# Patient Record
Sex: Male | Born: 1935 | Race: White | Hispanic: No | Marital: Married | Smoking: Former smoker
Health system: Southern US, Community
[De-identification: ages and names within clinical notes are randomized; demographics above are authoritative.]

## PROBLEM LIST (undated history)

## (undated) DIAGNOSIS — M720 Palmar fascial fibromatosis [Dupuytren]: Secondary | ICD-10-CM

## (undated) DIAGNOSIS — N189 Chronic kidney disease, unspecified: Secondary | ICD-10-CM

## (undated) DIAGNOSIS — D649 Anemia, unspecified: Secondary | ICD-10-CM

## (undated) DIAGNOSIS — N529 Male erectile dysfunction, unspecified: Secondary | ICD-10-CM

## (undated) DIAGNOSIS — G4730 Sleep apnea, unspecified: Secondary | ICD-10-CM

## (undated) DIAGNOSIS — M6530 Trigger finger, unspecified finger: Secondary | ICD-10-CM

## (undated) DIAGNOSIS — J9600 Acute respiratory failure, unspecified whether with hypoxia or hypercapnia: Secondary | ICD-10-CM

## (undated) DIAGNOSIS — N400 Enlarged prostate without lower urinary tract symptoms: Secondary | ICD-10-CM

## (undated) DIAGNOSIS — I499 Cardiac arrhythmia, unspecified: Secondary | ICD-10-CM

## (undated) DIAGNOSIS — E785 Hyperlipidemia, unspecified: Secondary | ICD-10-CM

## (undated) DIAGNOSIS — M069 Rheumatoid arthritis, unspecified: Secondary | ICD-10-CM

## (undated) DIAGNOSIS — I2510 Atherosclerotic heart disease of native coronary artery without angina pectoris: Secondary | ICD-10-CM

## (undated) DIAGNOSIS — E119 Type 2 diabetes mellitus without complications: Secondary | ICD-10-CM

## (undated) DIAGNOSIS — I10 Essential (primary) hypertension: Secondary | ICD-10-CM

## (undated) HISTORY — DX: Essential (primary) hypertension: I10

## (undated) HISTORY — DX: Anemia, unspecified: D64.9

## (undated) HISTORY — DX: Trigger finger, unspecified finger: M65.30

## (undated) HISTORY — DX: Atherosclerotic heart disease of native coronary artery without angina pectoris: I25.10

## (undated) HISTORY — DX: Rheumatoid arthritis, unspecified: M06.9

## (undated) HISTORY — DX: Acute respiratory failure, unspecified whether with hypoxia or hypercapnia: J96.00

## (undated) HISTORY — DX: Type 2 diabetes mellitus without complications: E11.9

## (undated) HISTORY — DX: Hyperlipidemia, unspecified: E78.5

## (undated) HISTORY — DX: Cardiac arrhythmia, unspecified: I49.9

## (undated) HISTORY — PX: INSERT / REPLACE / REMOVE PACEMAKER: SUR710

---

## 2010-05-26 ENCOUNTER — Ambulatory Visit: Payer: Self-pay | Admitting: Internal Medicine

## 2010-05-29 ENCOUNTER — Inpatient Hospital Stay: Payer: Self-pay | Admitting: Internal Medicine

## 2011-05-08 ENCOUNTER — Ambulatory Visit: Payer: Self-pay | Admitting: Cardiology

## 2011-08-13 ENCOUNTER — Encounter: Payer: Self-pay | Admitting: Cardiovascular Disease

## 2011-08-13 ENCOUNTER — Ambulatory Visit (INDEPENDENT_AMBULATORY_CARE_PROVIDER_SITE_OTHER): Payer: Medicare Other | Admitting: Cardiovascular Disease

## 2011-08-13 DIAGNOSIS — R001 Bradycardia, unspecified: Secondary | ICD-10-CM

## 2011-08-13 DIAGNOSIS — I498 Other specified cardiac arrhythmias: Secondary | ICD-10-CM

## 2011-08-13 DIAGNOSIS — I10 Essential (primary) hypertension: Secondary | ICD-10-CM

## 2011-08-13 DIAGNOSIS — R0602 Shortness of breath: Secondary | ICD-10-CM

## 2011-08-13 DIAGNOSIS — I441 Atrioventricular block, second degree: Secondary | ICD-10-CM

## 2011-08-13 DIAGNOSIS — E119 Type 2 diabetes mellitus without complications: Secondary | ICD-10-CM | POA: Insufficient documentation

## 2011-08-13 NOTE — Assessment & Plan Note (Signed)
Underlying atrial rate in the 70s. Ventricular rate in the 30s. Relatively asymptomatic. We have suggested repeat EKG or Holter testing if he has worsening symptoms of shortness of breath or edema, lightheadedness or near-syncope. No plan for pacemaker at this time.

## 2011-08-13 NOTE — Assessment & Plan Note (Signed)
Significant bradycardia noted. He reports being asymptomatic. Holter does show improvement in his heart rate with exertion. He is able to exert himself on a treadmill without symptoms of lightheadedness or near syncope. We have suggested that we continue to monitor him closely and that if he has symptoms of lightheadedness or worsening shortness of breath or edema, that he contact Dr. Graciela Husbands, Dr. Lady Gary or our office for further evaluation.

## 2011-08-13 NOTE — Progress Notes (Signed)
Patient ID: Randy Lopez, male    DOB: 01/02/1931, 77 y.o.   MRN: [id number]  HPI Comments: Randy Lopez is a very pleasant 77 year old gentleman, patient of Dr. Daniel Nones who presents for a second opinion regarding his bradycardia.  He reports that he has had a low heart rate for quite some time. He does have a history of hypertension, diabetes, rheumatoid arthritis, hyperlipidemia. He did smoke for 30 years, heavily during that time. He does have chronic mild shortness of breath but is able to exert himself without any significant symptoms. He reports being able to treadmill for at least 15 minutes at a time. He denies any lightheadedness or dizziness, no syncope. He denies any significant orthostasis with standing.  He is not on any inhalers.  He did complete a Holter monitor that showed bradycardia with average heart rate 49 beats per minute, minimum heart rate 31 beats per minute with second degree heart block noted, longest pause of 2.8 seconds at 2:30 in the morning.  He has been diagnosed with sleep apnea though reports that he does not need CPAP.  He did have a stress test and was able to walk for 4 minutes achieving 5.8 METS. This was done June 2011. He showed no significant ischemia, normal LV function  Echocardiogram done 05/2010 showed normal LV function, mild MR, mild diastolic dysfunction.  Sleep apnea study showed snoring 8% of the time, mild to moderate OSA, CPAP was recommended  EKG shows sinus rhythm with second-degree AV block, 2-1 conduction,  Left anterior fascicular block   Outpatient Encounter Prescriptions as of 08/13/2011  Medication Sig Dispense Refill  . amLODipine (NORVASC) 5 MG tablet Take 5 mg by mouth daily.        . calcium carbonate (OS-CAL) 600 MG TABS Take 600 mg by mouth 2 (two) times daily with a meal.        . Cholecalciferol (VITAMIN D3) 1000 UNITS CAPS Take 1,000 Units by mouth 1 day or 1 dose.        . folic acid (FOLVITE) 1 MG tablet Take 1 mg by  mouth daily.        Marland Kitchen glimepiride (AMARYL) 4 MG tablet Take 4 mg by mouth daily before breakfast.        . hydrochlorothiazide (,MICROZIDE/HYDRODIURIL,) 12.5 MG capsule Take 12.5 mg by mouth daily.        Marland Kitchen inFLIXimab (REMICADE) 100 MG injection Inject 100 mg into the vein every 8 (eight) weeks.        Marland Kitchen lisinopril (PRINIVIL,ZESTRIL) 40 MG tablet Take 40 mg by mouth daily.        . methotrexate 2.5 MG tablet Take 2.5 mg by mouth. Take 6 tablets by mouth once a week.        Review of Systems  Constitutional: Negative.   HENT: Negative.   Eyes: Negative.   Respiratory: Negative.   Cardiovascular: Negative.   Gastrointestinal: Negative.   Musculoskeletal: Negative.   Skin: Negative.   Neurological: Negative.   Hematological: Negative.   Psychiatric/Behavioral: Negative.   All other systems reviewed and are negative.    BP 122/60  Pulse 32  Ht 6\' 2"  (1.88 m)  Wt 239 lb (108.41 kg)  BMI 30.69 kg/m2   Physical Exam  Nursing note and vitals reviewed. Constitutional: He is oriented to person, place, and time. He appears well-developed and well-nourished.  HENT:  Head: Normocephalic.  Nose: Nose normal.  Mouth/Throat: Oropharynx is clear and moist.  Eyes: Conjunctivae are  normal. Pupils are equal, round, and reactive to light.  Neck: Normal range of motion. Neck supple. No JVD present.  Cardiovascular: Regular rhythm, S1 normal, S2 normal, normal heart sounds and intact distal pulses.  Bradycardia present.  Exam reveals no gallop and no friction rub.   No murmur heard.      Trace edema above the sock line  Pulmonary/Chest: Effort normal and breath sounds normal. No respiratory distress. He has no wheezes. He has no rales. He exhibits no tenderness.  Abdominal: Soft. Bowel sounds are normal. He exhibits no distension. There is no tenderness.  Musculoskeletal: Normal range of motion. He exhibits no edema and no tenderness.  Lymphadenopathy:    He has no cervical adenopathy.    Neurological: He is alert and oriented to person, place, and time. Coordination normal.  Skin: Skin is warm and dry. No rash noted. No erythema.  Psychiatric: He has a normal mood and affect. His behavior is normal. Judgment and thought content normal.           Assessment and Plan

## 2011-08-13 NOTE — Assessment & Plan Note (Signed)
He does have mild shortness of breath though this is stable. He does have a 30 year smoking history, currently not on inhalers. Uncertain if his mild shortness of breath is secondary to bradycardia or mild COPD. We have suggested if his shortness of breath gets worse, that he contact any one of his physicians.

## 2011-08-13 NOTE — Assessment & Plan Note (Signed)
We have encouraged continued exercise, careful diet management in an effort to lose weight. 

## 2011-08-13 NOTE — Assessment & Plan Note (Signed)
Blood pressure is well controlled on today's visit. No changes made to the medications. 

## 2011-08-13 NOTE — Patient Instructions (Signed)
You are doing well. No medication changes were made. Please call us if you have new issues that need to be addressed before your next appt.  We will call you for a follow up Appt. In 12 months  

## 2011-08-16 ENCOUNTER — Encounter: Payer: Self-pay | Admitting: Cardiovascular Disease

## 2012-10-14 DIAGNOSIS — R972 Elevated prostate specific antigen [PSA]: Secondary | ICD-10-CM | POA: Insufficient documentation

## 2013-06-04 ENCOUNTER — Ambulatory Visit: Payer: Self-pay | Admitting: Cardiology

## 2013-06-04 LAB — BASIC METABOLIC PANEL
Anion Gap: 5 — ABNORMAL LOW (ref 7–16)
BUN: 22 mg/dL — ABNORMAL HIGH (ref 7–18)
Chloride: 104 mmol/L (ref 98–107)
Creatinine: 1.6 mg/dL — ABNORMAL HIGH (ref 0.60–1.30)
EGFR (Non-African Amer.): 40 — ABNORMAL LOW
Potassium: 4 mmol/L (ref 3.5–5.1)
Sodium: 138 mmol/L (ref 136–145)

## 2013-06-04 LAB — PROTIME-INR: Prothrombin Time: 16.4 secs — ABNORMAL HIGH (ref 11.5–14.7)

## 2013-06-17 ENCOUNTER — Ambulatory Visit: Payer: Self-pay | Admitting: Cardiology

## 2014-06-06 ENCOUNTER — Inpatient Hospital Stay: Payer: Self-pay | Admitting: Internal Medicine

## 2014-06-06 LAB — COMPREHENSIVE METABOLIC PANEL
ALBUMIN: 3.4 g/dL (ref 3.4–5.0)
ALK PHOS: 86 U/L
ANION GAP: 6 — AB (ref 7–16)
BUN: 32 mg/dL — AB (ref 7–18)
Bilirubin,Total: 0.8 mg/dL (ref 0.2–1.0)
CO2: 27 mmol/L (ref 21–32)
CREATININE: 1.71 mg/dL — AB (ref 0.60–1.30)
Calcium, Total: 9 mg/dL (ref 8.5–10.1)
Chloride: 102 mmol/L (ref 98–107)
EGFR (Non-African Amer.): 37 — ABNORMAL LOW
GFR CALC AF AMER: 43 — AB
Glucose: 183 mg/dL — ABNORMAL HIGH (ref 65–99)
OSMOLALITY: 282 (ref 275–301)
POTASSIUM: 4.5 mmol/L (ref 3.5–5.1)
SGOT(AST): 158 U/L — ABNORMAL HIGH (ref 15–37)
SGPT (ALT): 207 U/L — ABNORMAL HIGH (ref 12–78)
Sodium: 135 mmol/L — ABNORMAL LOW (ref 136–145)
TOTAL PROTEIN: 7.6 g/dL (ref 6.4–8.2)

## 2014-06-06 LAB — URINALYSIS, COMPLETE
BILIRUBIN, UR: NEGATIVE
BLOOD: NEGATIVE
Bacteria: NONE SEEN
Glucose,UR: NEGATIVE mg/dL (ref 0–75)
Hyaline Cast: 12
KETONE: NEGATIVE
Leukocyte Esterase: NEGATIVE
NITRITE: NEGATIVE
Ph: 5 (ref 4.5–8.0)
Protein: NEGATIVE
SQUAMOUS EPITHELIAL: NONE SEEN
Specific Gravity: 1.016 (ref 1.003–1.030)
WBC UR: <1 /HPF (ref 0–5)

## 2014-06-06 LAB — CBC WITH DIFFERENTIAL/PLATELET
Basophil #: 0.1 10*3/uL (ref 0.0–0.1)
Basophil %: 1.3 %
Eosinophil #: 0.1 10*3/uL (ref 0.0–0.7)
Eosinophil %: 1.8 %
HCT: 37.5 % — ABNORMAL LOW (ref 40.0–52.0)
HGB: 12.7 g/dL — AB (ref 13.0–18.0)
Lymphocyte #: 0.9 10*3/uL — ABNORMAL LOW (ref 1.0–3.6)
Lymphocyte %: 18.1 %
MCH: 31.1 pg (ref 26.0–34.0)
MCHC: 34 g/dL (ref 32.0–36.0)
MCV: 92 fL (ref 80–100)
Monocyte #: 0.2 x10 3/mm (ref 0.2–1.0)
Monocyte %: 4.7 %
Neutrophil #: 3.8 10*3/uL (ref 1.4–6.5)
Neutrophil %: 74.1 %
Platelet: 159 10*3/uL (ref 150–440)
RBC: 4.1 10*6/uL — ABNORMAL LOW (ref 4.40–5.90)
RDW: 14.4 % (ref 11.5–14.5)
WBC: 5.2 10*3/uL (ref 3.8–10.6)

## 2014-06-06 LAB — TROPONIN I

## 2014-06-06 LAB — LIPASE, BLOOD: Lipase: 312 U/L (ref 73–393)

## 2014-06-07 LAB — COMPREHENSIVE METABOLIC PANEL
AST: 68 U/L — AB (ref 15–37)
Albumin: 2.7 g/dL — ABNORMAL LOW (ref 3.4–5.0)
Alkaline Phosphatase: 66 U/L
Anion Gap: 6 — ABNORMAL LOW (ref 7–16)
BUN: 28 mg/dL — ABNORMAL HIGH (ref 7–18)
Bilirubin,Total: 0.5 mg/dL (ref 0.2–1.0)
CALCIUM: 8 mg/dL — AB (ref 8.5–10.1)
CREATININE: 1.55 mg/dL — AB (ref 0.60–1.30)
Chloride: 106 mmol/L (ref 98–107)
Co2: 22 mmol/L (ref 21–32)
EGFR (African American): 48 — ABNORMAL LOW
GFR CALC NON AF AMER: 42 — AB
GLUCOSE: 112 mg/dL — AB (ref 65–99)
Osmolality: 274 (ref 275–301)
Potassium: 4.6 mmol/L (ref 3.5–5.1)
SGPT (ALT): 107 U/L — ABNORMAL HIGH (ref 12–78)
Sodium: 134 mmol/L — ABNORMAL LOW (ref 136–145)
Total Protein: 6.2 g/dL — ABNORMAL LOW (ref 6.4–8.2)

## 2014-06-07 LAB — CBC WITH DIFFERENTIAL/PLATELET
Basophil #: 0.1 10*3/uL (ref 0.0–0.1)
Basophil %: 1.6 %
Eosinophil #: 0 10*3/uL (ref 0.0–0.7)
Eosinophil %: 1 %
HCT: 31.2 % — ABNORMAL LOW (ref 40.0–52.0)
HGB: 10.7 g/dL — ABNORMAL LOW (ref 13.0–18.0)
Lymphocyte #: 0.8 10*3/uL — ABNORMAL LOW (ref 1.0–3.6)
Lymphocyte %: 20.8 %
MCH: 31.1 pg (ref 26.0–34.0)
MCHC: 34.4 g/dL (ref 32.0–36.0)
MCV: 90 fL (ref 80–100)
Monocyte #: 0.3 x10 3/mm (ref 0.2–1.0)
Monocyte %: 8.9 %
NEUTROS ABS: 2.6 10*3/uL (ref 1.4–6.5)
Neutrophil %: 67.7 %
Platelet: 132 10*3/uL — ABNORMAL LOW (ref 150–440)
RBC: 3.45 10*6/uL — ABNORMAL LOW (ref 4.40–5.90)
RDW: 14.3 % (ref 11.5–14.5)
WBC: 3.9 10*3/uL (ref 3.8–10.6)

## 2014-06-08 LAB — CBC WITH DIFFERENTIAL/PLATELET
BASOS PCT: 1.5 %
Basophil #: 0.1 10*3/uL (ref 0.0–0.1)
EOS PCT: 6.9 %
Eosinophil #: 0.3 10*3/uL (ref 0.0–0.7)
HCT: 35.9 % — ABNORMAL LOW (ref 40.0–52.0)
HGB: 12.2 g/dL — ABNORMAL LOW (ref 13.0–18.0)
Lymphocyte #: 1 10*3/uL (ref 1.0–3.6)
Lymphocyte %: 21.3 %
MCH: 31 pg (ref 26.0–34.0)
MCHC: 34 g/dL (ref 32.0–36.0)
MCV: 91 fL (ref 80–100)
Monocyte #: 0.8 x10 3/mm (ref 0.2–1.0)
Monocyte %: 17.2 %
Neutrophil #: 2.4 10*3/uL (ref 1.4–6.5)
Neutrophil %: 53.1 %
Platelet: 164 10*3/uL (ref 150–440)
RBC: 3.94 10*6/uL — AB (ref 4.40–5.90)
RDW: 14.3 % (ref 11.5–14.5)
WBC: 4.5 10*3/uL (ref 3.8–10.6)

## 2014-06-08 LAB — COMPREHENSIVE METABOLIC PANEL
ALK PHOS: 73 U/L
ALT: 95 U/L — AB (ref 12–78)
Albumin: 3.1 g/dL — ABNORMAL LOW (ref 3.4–5.0)
Anion Gap: 5 — ABNORMAL LOW (ref 7–16)
BILIRUBIN TOTAL: 0.5 mg/dL (ref 0.2–1.0)
BUN: 23 mg/dL — ABNORMAL HIGH (ref 7–18)
Calcium, Total: 8.4 mg/dL — ABNORMAL LOW (ref 8.5–10.1)
Chloride: 106 mmol/L (ref 98–107)
Co2: 23 mmol/L (ref 21–32)
Creatinine: 1.5 mg/dL — ABNORMAL HIGH (ref 0.60–1.30)
EGFR (African American): 50 — ABNORMAL LOW
EGFR (Non-African Amer.): 43 — ABNORMAL LOW
GLUCOSE: 132 mg/dL — AB (ref 65–99)
OSMOLALITY: 274 (ref 275–301)
Potassium: 4.7 mmol/L (ref 3.5–5.1)
SGOT(AST): 61 U/L — ABNORMAL HIGH (ref 15–37)
Sodium: 134 mmol/L — ABNORMAL LOW (ref 136–145)
TOTAL PROTEIN: 7 g/dL (ref 6.4–8.2)

## 2014-06-08 LAB — BETA STREP CULTURE(ARMC)

## 2014-06-09 LAB — CBC WITH DIFFERENTIAL/PLATELET
BASOS PCT: 1.7 %
Basophil #: 0.1 10*3/uL (ref 0.0–0.1)
EOS ABS: 0.3 10*3/uL (ref 0.0–0.7)
EOS PCT: 7 %
HCT: 33.2 % — ABNORMAL LOW (ref 40.0–52.0)
HGB: 11.6 g/dL — ABNORMAL LOW (ref 13.0–18.0)
Lymphocyte #: 1 10*3/uL (ref 1.0–3.6)
Lymphocyte %: 20 %
MCH: 31.5 pg (ref 26.0–34.0)
MCHC: 34.9 g/dL (ref 32.0–36.0)
MCV: 90 fL (ref 80–100)
MONO ABS: 0.8 x10 3/mm (ref 0.2–1.0)
Monocyte %: 17.1 %
Neutrophil #: 2.7 10*3/uL (ref 1.4–6.5)
Neutrophil %: 54.2 %
PLATELETS: 168 10*3/uL (ref 150–440)
RBC: 3.68 10*6/uL — ABNORMAL LOW (ref 4.40–5.90)
RDW: 14.5 % (ref 11.5–14.5)
WBC: 4.9 10*3/uL (ref 3.8–10.6)

## 2014-06-09 LAB — COMPREHENSIVE METABOLIC PANEL
ALT: 77 U/L (ref 12–78)
AST: 46 U/L — AB (ref 15–37)
Albumin: 3.1 g/dL — ABNORMAL LOW (ref 3.4–5.0)
Alkaline Phosphatase: 72 U/L
Anion Gap: 8 (ref 7–16)
BUN: 24 mg/dL — ABNORMAL HIGH (ref 7–18)
Bilirubin,Total: 0.5 mg/dL (ref 0.2–1.0)
CO2: 22 mmol/L (ref 21–32)
Calcium, Total: 8.1 mg/dL — ABNORMAL LOW (ref 8.5–10.1)
Chloride: 105 mmol/L (ref 98–107)
Creatinine: 1.37 mg/dL — ABNORMAL HIGH (ref 0.60–1.30)
EGFR (African American): 56 — ABNORMAL LOW
EGFR (Non-African Amer.): 48 — ABNORMAL LOW
GLUCOSE: 120 mg/dL — AB (ref 65–99)
Osmolality: 275 (ref 275–301)
POTASSIUM: 4.7 mmol/L (ref 3.5–5.1)
Sodium: 135 mmol/L — ABNORMAL LOW (ref 136–145)
Total Protein: 6.8 g/dL (ref 6.4–8.2)

## 2014-06-11 LAB — CULTURE, BLOOD (SINGLE)

## 2014-06-21 ENCOUNTER — Ambulatory Visit: Payer: Self-pay

## 2014-10-07 DIAGNOSIS — Z950 Presence of cardiac pacemaker: Secondary | ICD-10-CM | POA: Insufficient documentation

## 2014-10-07 DIAGNOSIS — I340 Nonrheumatic mitral (valve) insufficiency: Secondary | ICD-10-CM | POA: Insufficient documentation

## 2014-11-04 DIAGNOSIS — N183 Chronic kidney disease, stage 3 unspecified: Secondary | ICD-10-CM | POA: Insufficient documentation

## 2015-04-15 NOTE — Op Note (Signed)
PATIENT NAME:  Randy Lopez, Randy Lopez MR#:  [id number] DATE OF BIRTH:  04-May-1931  DATE OF PROCEDURE:  06/17/2013  PRIMARY CARE PHYSICIAN:  Ramonita Lab, MD  PREPROCEDURAL DIAGNOSIS: Mobitz type II second degree AV block.   PROCEDURE: Dual-chamber pacemaker implantation.   POSTPROCEDURAL DIAGNOSIS: Atrial sensing with ventricular pacing.   INDICATION: The patient is a 81 year old gentleman who has been experiencing progressive exertional dyspnea. The patient recently was found to have intermittent episodes of Mobitz 2 second degree AV block with bradycardia with heart rates in the 30s and 40s. Procedure, risks, benefits and alternatives of permanent pacemaker implantation were explained to the patient and informed written consent was obtained.   DESCRIPTION OF PROCEDURE: He was brought to the operating room in a fasting state. The left pectoral region was prepped and draped in the usual sterile manner. Anesthesia was obtained with 1% Xylocaine locally. A 6 cm incision was performed over the left pectoral region. Access was obtained to the left subclavian vein by fine needle aspiration. A Medtronic ventricular [phone]) and atrial (5592) leads were positioned in the right ventricular apex and right atrial appendage under fluoroscopic guidance. After proper thresholds were obtained, the leads were sutured in place. The pacemaker pocket was irrigated with gentamicin solution. The leads were connected to a dual chamber rate responsive pacemaker generator (Medtronic Adapta ADRO1) and positioned into the pocket. The pocket was closed with 2-0 and 4-0 Vicryl, respectively. Steri-Strips and a  pressure dressing were applied    ____________________________ Isaias Cowman, MD ap:cc D: 06/17/2013 13:23:54 ET T: 06/17/2013 14:04:23 ET JOB#: [id number]  cc: Isaias Cowman, MD, <Dictator> Isaias Cowman MD ELECTRONICALLY SIGNED 06/20/2013 10:09

## 2015-04-16 NOTE — Discharge Summary (Signed)
PATIENT NAME:  Randy Lopez, Randy Lopez MR#:  [id number] DATE OF BIRTH:  Sep 16, 1931  DATE OF ADMISSION:  06/06/2014 DATE OF DISCHARGE:  06/09/2014   FINAL DIAGNOSES:  1. Rash, fever, most consistent with acute Lyme disease.  2. Systemic inflammatory response syndrome secondary to #1.  3. Rheumatoid arthritis.  4. Diabetes mellitus, controlled.  5. Hypertension.  6. Hyperlipidemia.   HISTORY AND PHYSICAL: Please see dictated admission history and physical.   HOSPITAL COURSE: The patient was admitted with transaminitis, fevers, anemia, and subsequently developed thrombocytopenia. He was noted to have a bulls-eye rash on his left lower leg most consistent with erythema chronicum migrans. He was placed on doxycycline and Rocephin. His temperatures resolved, and his blood counts improved. His transaminitis also improved. Blood cultures were negative. Chest x-ray was unremarkable. Ultrasound showed multiple gallstones, without any evidence of gallbladder inflammation and no other source for the transaminitis.   Initial serologies for Lyme, [facility] spotted fever and ehrlichiosis were all negative; however, it is recognized that in the acute phase, these may be negative. He was switch over to oral doxycycline only and followed for the next 24 hours, with continued improvement in the rash and further improvement in his blood work. It was felt that he was ready for discharge home, and he will be discharged to home in stable condition, with physical activity up as tolerated. He should follow a carbohydrate controlled diet. He should check his sugar and blood pressure daily. He will follow up in our office in the next 1 to 2 weeks.   DISCHARGE MEDICATIONS:  1. Lisinopril 40 mg p.o. daily.  2. Amlodipine 5 mg p.o. daily.  3. Folate 1 mg p.o. daily.  4. Vitamin D 1000 units p.o. daily.  5. Aspirin 81 mg p.o. daily.  6. Hydrochlorothiazide 12.5 mg p.o. daily.  7. Doxycycline 100 mg p.o. b.i.d. for 10 days.  8.  Ranitidine 150 mg p.o. b.i.d. for 10 days, then twice a day as needed for dyspepsia.  He will hold glimepiride 4 mg daily currently; if his blood sugars are greater than 150 on 2 occasions, he will restart this medication. He is advised to hold methotrexate until he is finished with the doxycycline.   ____________________________ Adin Hector, MD bjk:lb D: 06/09/2014 07:54:54 ET T: 06/09/2014 08:01:12 ET JOB#: [id number]  cc: Tama High III, MD, <Dictator> Ramonita Lab MD ELECTRONICALLY SIGNED 06/15/2014 12:49

## 2015-04-16 NOTE — H&P (Signed)
PATIENT NAME:  Randy Lopez, BART MR#:  [id number] DATE OF BIRTH:  1931/01/28  DATE OF ADMISSION:  06/06/2014  REFERRING PHYSICIAN: Dr. Lavonia Drafts.  REASON FOR ADMISSION: Fevers, chills, hypotension and a rash.   HISTORY OF PRESENT ILLNESS: This is an 81 year old gentleman with history of rheumatoid arthritis immunocompromise on methotrexate, who has had fevers and chills for over one week. His symptoms began last Sunday. He has been feeling quite fatigued and having no energy. He had temperatures to 102 to 103 with shaking chills. He also developed a rash on his left lower extremity. He did have a tick exposure and has a pet dog at home but does not recall a tick bite. He had recently finished Remicade, Zosyn several months ago. He has had no severe headache, cough, shortness of breath, chest pain, abdominal pain, diarrhea or nausea or vomiting. He does have a sore throat over the last several days. He also reports belching a lot. He has  many grandchildren and has had some exposure to them over the last several weeks, but none of them have been sick.   PAST MEDICAL HISTORY: 1. Rheumatoid arthritis, previously on Remicade, steroids and methotrexate, currently just on methotrexate. Follows with Dr Lindon Romp.  2. Diabetes well-managed.  3. Hypertension.  4. Hyperlipidemia.  5. Benign prostatic hypertrophy.  6. Osteoporosis.  7. Chronic insomnia.   PAST SURGICAL HISTORY: TURP.   ALLERGIES: THE PATIENT IS ALLERGIC TO  LIPITOR AND FLOMAX.   SOCIAL HISTORY: The patient lives with his second wife who he has been married for seven years. He does not smoke or drink. 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 No other animal exposures. No recent travel. No history of tuberculosis contacts.   FAMILY HISTORY: Positive for diabetes and strokes.   REVIEW OF SYSTEMS: Eleven-systems reviewed and negative except as per history of present illness.   MEDICATIONS: Outpatient medications  include:  1. Methotrexate 2.5 mg tablets 3 tablets 3 times a week on Wednesday and Thursday.  2. Aspirin 81 mg once a day.  3. Lisinopril 40 mg once a day in the morning.  4. Glimepiride 4 mg 1 tablet  5. Lisinopril 40 mg once a day.  6. Amlodipine 5 mg once a day.  7. Hydrochlorothiazide 12.5 mg once a day.  8. Folic acid 1 mg daily.  9. Vitamin D3 1000 units once a day.   PHYSICAL EXAMINATION: VITAL SIGNS: Temperature at home was 102. The patient has taken Tylenol so his current temperature 99.4, pulse 72, blood pressure 108/57, respirations 18, sat 97% on room air.  GENERAL: He is ill appearing lying in bed.  HEENT: Pupils equal, round, reactive to light and accommodation. Extraocular movements are intact. Sclerae anicteric.  OROPHARYNX: Clear, but he does have some erythema in his pharynx. He has no thrush or oral lesions.  NECK: Supple. No anterior cervical, posterior cervical or supraclavicular lymphadenopathy.  HEART: Regular, but quite distant.  LUNGS: Clear to auscultation bilaterally.  ABDOMEN: Soft, mildly distended, nontender, slightly tympanic.  EXTREMITIES: No clubbing, cyanosis or edema.  SKIN: On his left knee medial calf he has an approximately softball-sized area of erythema with a central clearing. This was marked. There is no other skin lesions.  VASCULAR: Pulses are intact bilaterally.   DIAGNOSTIC DATA: White count 5.2, hemoglobin 12.7, platelets 159, troponin negative, lipase 312, BUN 32, creatinine 1.71, sodium 135. AST was elevated at 158 and ALT 207, alkaline phosphatase is normal at  86, total protein normal. Albumin 3.4. Urinalysis showed no white cells, no red cells. No protein. Chest x-ray showed no acute findings. There is a pacemaker in place.   IMPRESSION: A 81 year old gentleman on methotrexate for rheumatoid arthritis, admitted with fevers, chills for one week, as well as a rash in the left lower extremity and elevated AST and ALT. Possible etiologies in  this immunocompromised would be a tickborne illness or bacteremia. With the elevated LFTs, hepatitis is possible as well.   PLAN: 1. SAdmit.  2. Continue ceftriaxone to cover for bacteremia potential and IV doxycycline.  3. Check [facility] spotted fever antibodies and Lyme antibodies.  3. Right upper quadrant ultrasound to evaluate the elevated LFTs.  4. We will hold the methotrexate at this point given the elevated LFTs. He is not due for his next  dose until Wednesday.  5. Diabetes. Continue his glimepiride.  6. Hypertension. Continue lisinopril hydrochlorothiazide and Norvasc.  7. Deep vein thrombosis prophylaxis. We will place the patient on subcutaneous heparin.    ____________________________ Cheral Marker. Ola Spurr, MD dpf:sg D: 06/06/2014 08:11:00 ET T: 06/06/2014 08:39:40 ET JOB#: [id number]  cc: Cheral Marker. Ola Spurr, MD, <Dictator> Breylon Sherrow Ola Spurr MD ELECTRONICALLY SIGNED 06/08/2014 6:52

## 2016-02-23 ENCOUNTER — Ambulatory Visit: Payer: Self-pay

## 2016-02-23 ENCOUNTER — Ambulatory Visit: Payer: Self-pay | Admitting: Urology

## 2016-05-08 DIAGNOSIS — M069 Rheumatoid arthritis, unspecified: Secondary | ICD-10-CM | POA: Insufficient documentation

## 2016-05-18 ENCOUNTER — Other Ambulatory Visit: Payer: Self-pay | Admitting: Student

## 2016-05-18 DIAGNOSIS — R1310 Dysphagia, unspecified: Secondary | ICD-10-CM

## 2016-05-23 ENCOUNTER — Ambulatory Visit
Admission: RE | Admit: 2016-05-23 | Discharge: 2016-05-23 | Disposition: A | Discharge status: Home / Self Care | Payer: Medicare Other | Source: Ambulatory Visit | Attending: Student | Admitting: Student

## 2016-05-23 DIAGNOSIS — R1310 Dysphagia, unspecified: Secondary | ICD-10-CM | POA: Diagnosis not present

## 2016-05-29 ENCOUNTER — Encounter: Payer: Self-pay | Admitting: *Deleted

## 2016-05-30 ENCOUNTER — Ambulatory Visit: Payer: Medicare Other | Admitting: Anesthesiology

## 2016-05-30 ENCOUNTER — Ambulatory Visit
Admission: RE | Admit: 2016-05-30 | Discharge: 2016-05-30 | Disposition: A | Discharge status: Home / Self Care | Payer: Medicare Other | Source: Ambulatory Visit | Attending: Unknown Physician Specialty | Admitting: Unknown Physician Specialty

## 2016-05-30 ENCOUNTER — Encounter
Admission: RE | Disposition: A | Discharge status: Home / Self Care | Payer: Self-pay | Source: Ambulatory Visit | Attending: Unknown Physician Specialty

## 2016-05-30 DIAGNOSIS — I2510 Atherosclerotic heart disease of native coronary artery without angina pectoris: Secondary | ICD-10-CM | POA: Insufficient documentation

## 2016-05-30 DIAGNOSIS — Z7982 Long term (current) use of aspirin: Secondary | ICD-10-CM | POA: Insufficient documentation

## 2016-05-30 DIAGNOSIS — K2980 Duodenitis without bleeding: Secondary | ICD-10-CM | POA: Diagnosis not present

## 2016-05-30 DIAGNOSIS — N529 Male erectile dysfunction, unspecified: Secondary | ICD-10-CM | POA: Diagnosis not present

## 2016-05-30 DIAGNOSIS — E1122 Type 2 diabetes mellitus with diabetic chronic kidney disease: Secondary | ICD-10-CM | POA: Diagnosis not present

## 2016-05-30 DIAGNOSIS — N400 Enlarged prostate without lower urinary tract symptoms: Secondary | ICD-10-CM | POA: Insufficient documentation

## 2016-05-30 DIAGNOSIS — M069 Rheumatoid arthritis, unspecified: Secondary | ICD-10-CM | POA: Diagnosis not present

## 2016-05-30 DIAGNOSIS — Z87891 Personal history of nicotine dependence: Secondary | ICD-10-CM | POA: Insufficient documentation

## 2016-05-30 DIAGNOSIS — E785 Hyperlipidemia, unspecified: Secondary | ICD-10-CM | POA: Diagnosis not present

## 2016-05-30 DIAGNOSIS — Z7952 Long term (current) use of systemic steroids: Secondary | ICD-10-CM | POA: Diagnosis not present

## 2016-05-30 DIAGNOSIS — R1310 Dysphagia, unspecified: Secondary | ICD-10-CM | POA: Diagnosis not present

## 2016-05-30 DIAGNOSIS — N189 Chronic kidney disease, unspecified: Secondary | ICD-10-CM | POA: Diagnosis not present

## 2016-05-30 DIAGNOSIS — Z79899 Other long term (current) drug therapy: Secondary | ICD-10-CM | POA: Diagnosis not present

## 2016-05-30 DIAGNOSIS — G4730 Sleep apnea, unspecified: Secondary | ICD-10-CM | POA: Insufficient documentation

## 2016-05-30 DIAGNOSIS — I129 Hypertensive chronic kidney disease with stage 1 through stage 4 chronic kidney disease, or unspecified chronic kidney disease: Secondary | ICD-10-CM | POA: Insufficient documentation

## 2016-05-30 DIAGNOSIS — Z862 Personal history of diseases of the blood and blood-forming organs and certain disorders involving the immune mechanism: Secondary | ICD-10-CM | POA: Diagnosis not present

## 2016-05-30 HISTORY — DX: Benign prostatic hyperplasia without lower urinary tract symptoms: N40.0

## 2016-05-30 HISTORY — DX: Palmar fascial fibromatosis (dupuytren): M72.0

## 2016-05-30 HISTORY — DX: Sleep apnea, unspecified: G47.30

## 2016-05-30 HISTORY — DX: Chronic kidney disease, unspecified: N18.9

## 2016-05-30 HISTORY — DX: Male erectile dysfunction, unspecified: N52.9

## 2016-05-30 HISTORY — PX: ESOPHAGOGASTRODUODENOSCOPY (EGD) WITH PROPOFOL: SHX5813

## 2016-05-30 SURGERY — ESOPHAGOGASTRODUODENOSCOPY (EGD) WITH PROPOFOL
Anesthesia: General

## 2016-05-30 MED ORDER — PROPOFOL 10 MG/ML IV BOLUS
INTRAVENOUS | Status: DC | PRN
  Administered 2016-05-30: 20 mg via INTRAVENOUS
  Administered 2016-05-30: 50 mg via INTRAVENOUS
  Administered 2016-05-30: 30 mg via INTRAVENOUS
  Administered 2016-05-30: 20 mg via INTRAVENOUS
  Administered 2016-05-30: 50 mg via INTRAVENOUS

## 2016-05-30 MED ORDER — GLYCOPYRROLATE 0.2 MG/ML IJ SOLN
INTRAMUSCULAR | Status: DC | PRN
  Administered 2016-05-30: 0.2 mg via INTRAVENOUS

## 2016-05-30 MED ORDER — SODIUM CHLORIDE 0.9 % IV SOLN
INTRAVENOUS | Status: DC
  Administered 2016-05-30: 1000 mL via INTRAVENOUS

## 2016-05-30 MED ORDER — LIDOCAINE HCL (CARDIAC) 20 MG/ML IV SOLN
INTRAVENOUS | Status: DC | PRN
  Administered 2016-05-30: 70 mg via INTRAVENOUS

## 2016-05-30 MED ORDER — SODIUM CHLORIDE 0.9 % IV SOLN: INTRAVENOUS | Status: DC

## 2016-05-30 NOTE — Transfer of Care (Signed)
Immediate Anesthesia Transfer of Care Note  Patient: Randy Lopez  Procedure(s) Performed: Procedure(s): ESOPHAGOGASTRODUODENOSCOPY (EGD) WITH PROPOFOL (N/A)  Patient Location: PACU  Anesthesia Type:General  Level of Consciousness: awake, alert , oriented and patient cooperative  Airway & Oxygen Therapy: Patient Spontanous Breathing and Patient connected to nasal cannula oxygen  Post-op Assessment: Report given to RN, Post -op Vital signs reviewed and stable and Patient moving all extremities  Post vital signs: Reviewed and stable  Last Vitals:  Filed Vitals:   05/30/16 0742 05/30/16 0859  BP: 133/61 106/63  Pulse: 64 85  Temp: 36.4 C 35.6 C  Resp: 16 17    Last Pain: There were no vitals filed for this visit.       Complications: No apparent anesthesia complications

## 2016-05-30 NOTE — H&P (Signed)
Primary Care Physician:  Adin Hector, MD Primary Gastroenterologist:  Dr. Vira Agar  Pre-Procedure History & Physical: HPI:  Randy Lopez is a 82 y.o. male is here for an endoscopy.   Past Medical History  Diagnosis Date  . Trigger finger (acquired)   . Other and unspecified hyperlipidemia   . Coronary atherosclerosis of native coronary artery   . Acute respiratory failure ([place])   . Rheumatoid arthritis(714.0)   . Essential hypertension, benign   . Type II or unspecified type diabetes mellitus without mention of complication, not stated as uncontrolled   . Anemia, unspecified   . Arrhythmia     A-Fib  . Chronic kidney disease   . Dupuytren's contracture   . ED (erectile dysfunction)   . Sleep apnea   . BPH (benign prostatic hyperplasia)     Past Surgical History  Procedure Laterality Date  . Insert / replace / remove pacemaker      Prior to Admission medications   Medication Sig Start Date End Date Taking? Authorizing Provider  aspirin 81 MG tablet Take 81 mg by mouth daily.   Yes Historical Provider, MD  predniSONE (DELTASONE) 10 MG tablet Take 10 mg by mouth daily with breakfast.   Yes Historical Provider, MD  amLODipine (NORVASC) 5 MG tablet Take 5 mg by mouth daily.      Historical Provider, MD  calcium carbonate (OS-CAL) 600 MG TABS Take 600 mg by mouth 2 (two) times daily with a meal.      Historical Provider, MD  Cholecalciferol (VITAMIN D3) 1000 UNITS CAPS Take 1,000 Units by mouth 1 day or 1 dose.      Historical Provider, MD  folic acid (FOLVITE) 1 MG tablet Take 1 mg by mouth daily.      Historical Provider, MD  glimepiride (AMARYL) 4 MG tablet Take 4 mg by mouth daily before breakfast.      Historical Provider, MD  hydrochlorothiazide (,MICROZIDE/HYDRODIURIL,) 12.5 MG capsule Take 12.5 mg by mouth daily.      Historical Provider, MD  inFLIXimab (REMICADE) 100 MG injection Inject 100 mg into the vein every 8 (eight) weeks.      Historical Provider, MD   lisinopril (PRINIVIL,ZESTRIL) 40 MG tablet Take 40 mg by mouth daily.      Historical Provider, MD  methotrexate 2.5 MG tablet Take 2.5 mg by mouth. Take 6 tablets by mouth once a week.    Historical Provider, MD    Allergies as of 05/29/2016 - Review Complete 05/29/2016  Allergen Reaction Noted  . Flomax [tamsulosin hcl]  08/13/2011  . Lipitor [atorvastatin calcium]  08/13/2011    History reviewed. No pertinent family history.  Social History   Social History  . Marital Status: Married    Spouse Name: N/A  . Number of Children: N/A  . Years of Education: N/A   Occupational History  . Not on file.   Social History Main Topics  . Smoking status: Former Smoker -- 30 years    Types: Cigarettes    Quit date: 02/04/1981  . Smokeless tobacco: Never Used  . Alcohol Use: No  . Drug Use: No  . Sexual Activity: Not on file   Other Topics Concern  . Not on file   Social History Narrative    Review of Systems: See HPI, otherwise negative ROS  Physical Exam: BP 133/61 mmHg  Pulse 64  Temp(Src) 97.6 F (36.4 C) (Tympanic)  Resp 16  Ht 6\' 2"  (1.88 m)  Wt 104.327 kg (230 lb)  BMI 29.52 kg/m2  SpO2 97% General:   Alert,  pleasant and cooperative in NAD Head:  Normocephalic and atraumatic. Neck:  Supple; no masses or thyromegaly. Lungs:  Clear throughout to auscultation.    Heart:  Regular rate and rhythm. Abdomen:  Soft, nontender and nondistended. Normal bowel sounds, without guarding, and without rebound.   Neurologic:  Alert and  oriented x4;  grossly normal neurologically.  Impression/Plan: Randy Lopez is here for an endoscopy to be performed for dysphagia  Risks, benefits, limitations, and alternatives regarding  endoscopy have been reviewed with the patient.  Questions have been answered.  All parties agreeable.   Gaylyn Cheers, MD  05/30/2016, 8:38 AM

## 2016-05-30 NOTE — Op Note (Signed)
[facility] Patient Name: Randy Lopez Procedure Date: 05/30/2016 8:40 AM MRN: [id number] Account #: [id number] Date of Birth: April 20, 1931 Admit Type: Outpatient Age: 82 Room: [facility] ENDO ROOM 4 Gender: Male Note Status: Finalized Procedure:            Upper GI endoscopy Indications:          Dysphagia Providers:            Manya Silvas, MD Referring MD:         Ramonita Lab, MD (Referring MD) Medicines:            Propofol per Anesthesia Complications:        No immediate complications. Procedure:            Pre-Anesthesia Assessment:                       - After reviewing the risks and benefits, the patient                        was deemed in satisfactory condition to undergo the                        procedure.                       After obtaining informed consent, the endoscope was                        passed under direct vision. Throughout the procedure,                        the patient's blood pressure, pulse, and oxygen                        saturations were monitored continuously. The Endoscope                        was introduced through the mouth, and advanced to the                        second part of duodenum. The upper GI endoscopy was                        accomplished without difficulty. The patient tolerated                        the procedure well. Findings:      No endoscopic abnormality was evident in the esophagus to explain the       patient's complaint of dysphagia. However the GEJ offered some       resistance to passage of the scope. It was decided, however, to proceed       with dilation of the entire esophagus. After examination of the       esophagus, stomach and duodenum A guidewire was placed and the scope was       withdrawn. Dilation was performed with a Savary dilator with mild       resistance at 15 mm, 16 mm and 17 mm. They went down easily.      The stomach was normal.      Localized moderate  inflammation characterized by erosions, erythema and  granularity was found in the duodenal bulb.      The second portion of the duodenum was normal. Impression:           - No endoscopic esophageal abnormality to explain                        patient's dysphagia. Esophagus dilated. Dilated.                       - Normal stomach.                       - Duodenitis.                       - Normal second portion of the duodenum.                       - No specimens collected. Recommendation:       - The findings and recommendations were discussed with                        the patient's family.                       - soft food for 3 days, eat slowly, chew well, take                        small bites. The duodenitis may be due to Helicobacter,                        will check blood test today. Manya Silvas, MD 05/30/2016 9:03:33 AM This report has been signed electronically. Number of Addenda: 0 Note Initiated On: 05/30/2016 8:40 AM      [facility]

## 2016-05-30 NOTE — Anesthesia Preprocedure Evaluation (Signed)
Anesthesia Evaluation  Patient identified by MRN, date of birth, ID band Patient awake    Reviewed: Allergy & Precautions, H&P , NPO status , Patient's Chart, lab work & pertinent test results, reviewed documented beta blocker date and time   History of Anesthesia Complications Negative for: history of anesthetic complications  Airway Mallampati: II  TM Distance: >3 FB Neck ROM: full    Dental no notable dental hx. (+) Partial Upper, Missing, Poor Dentition, Chipped   Pulmonary neg shortness of breath, sleep apnea , neg COPD, Recent URI , Residual Cough, former smoker,    Pulmonary exam normal breath sounds clear to auscultation       Cardiovascular Exercise Tolerance: Good hypertension, (-) angina+ CAD  (-) Past MI, (-) Cardiac Stents and (-) CABG negative cardio ROS Normal cardiovascular exam+ dysrhythmias Atrial Fibrillation (-) Valvular Problems/Murmurs Rhythm:regular Rate:Normal     Neuro/Psych negative neurological ROS  negative psych ROS   GI/Hepatic negative GI ROS, Neg liver ROS,   Endo/Other  diabetes, Oral Hypoglycemic Agents  Renal/GU CRFRenal disease  negative genitourinary   Musculoskeletal   Abdominal   Peds  Hematology negative hematology ROS (+)   Anesthesia Other Findings Past Medical History:   Trigger finger (acquired)                                    Other and unspecified hyperlipidemia                         Coronary atherosclerosis of native coronary ar*              Acute respiratory failure (HCC)                              Rheumatoid arthritis(714.0)                                  Essential hypertension, benign                               Type II or unspecified type diabetes mellitus *              Anemia, unspecified                                          Arrhythmia                                                     Comment:A-Fib   Chronic kidney disease                                        Dupuytren's contracture                                      ED (erectile dysfunction)  Sleep apnea                                                  BPH (benign prostatic hyperplasia)                           Reproductive/Obstetrics negative OB ROS                             Anesthesia Physical Anesthesia Plan  ASA: III  Anesthesia Plan: General   Post-op Pain Management:    Induction:   Airway Management Planned:   Additional Equipment:   Intra-op Plan:   Post-operative Plan:   Informed Consent: I have reviewed the patients History and Physical, chart, labs and discussed the procedure including the risks, benefits and alternatives for the proposed anesthesia with the patient or authorized representative who has indicated his/her understanding and acceptance.   Dental Advisory Given  Plan Discussed with: Anesthesiologist, CRNA and Surgeon  Anesthesia Plan Comments:         Anesthesia Quick Evaluation

## 2016-05-30 NOTE — Anesthesia Postprocedure Evaluation (Signed)
Anesthesia Post Note  Patient: [place]  Procedure(s) Performed: Procedure(s) (LRB): ESOPHAGOGASTRODUODENOSCOPY (EGD) WITH PROPOFOL (N/A)  Patient location during evaluation: PACU Anesthesia Type: General Level of consciousness: awake and alert Pain management: pain level controlled Vital Signs Assessment: post-procedure vital signs reviewed and stable Respiratory status: spontaneous breathing, nonlabored ventilation, respiratory function stable and patient connected to nasal cannula oxygen Cardiovascular status: blood pressure returned to baseline and stable Postop Assessment: no signs of nausea or vomiting Anesthetic complications: no    Last Vitals:  Filed Vitals:   05/30/16 0919 05/30/16 0929  BP: 110/74 121/74  Pulse: 79 75  Temp:    Resp: 17 11    Last Pain: There were no vitals filed for this visit.               Martha Clan

## 2016-05-31 ENCOUNTER — Encounter: Payer: Self-pay | Admitting: Unknown Physician Specialty

## 2016-05-31 LAB — H PYLORI, IGM, IGG, IGA AB
H Pylori IgG: <0.9 U/mL (ref 0.0–0.8)
H. Pylogi, Iga Abs: 28.5 units — ABNORMAL HIGH (ref 0.0–8.9)
H. Pylogi, Igm Abs: <9 units (ref 0.0–8.9)

## 2016-07-26 ENCOUNTER — Other Ambulatory Visit: Payer: Self-pay | Admitting: Internal Medicine

## 2016-07-26 DIAGNOSIS — R1013 Epigastric pain: Secondary | ICD-10-CM

## 2016-08-02 ENCOUNTER — Ambulatory Visit
Admission: RE | Admit: 2016-08-02 | Discharge: 2016-08-02 | Disposition: A | Discharge status: Home / Self Care | Payer: Medicare Other | Source: Ambulatory Visit | Attending: Internal Medicine | Admitting: Internal Medicine

## 2016-08-02 DIAGNOSIS — K8020 Calculus of gallbladder without cholecystitis without obstruction: Secondary | ICD-10-CM | POA: Insufficient documentation

## 2016-08-02 DIAGNOSIS — N281 Cyst of kidney, acquired: Secondary | ICD-10-CM | POA: Diagnosis not present

## 2016-08-02 DIAGNOSIS — R1013 Epigastric pain: Secondary | ICD-10-CM | POA: Diagnosis present

## 2016-08-02 DIAGNOSIS — R140 Abdominal distension (gaseous): Secondary | ICD-10-CM | POA: Insufficient documentation

## 2016-08-06 DIAGNOSIS — N281 Cyst of kidney, acquired: Secondary | ICD-10-CM | POA: Insufficient documentation

## 2016-08-06 DIAGNOSIS — K8020 Calculus of gallbladder without cholecystitis without obstruction: Secondary | ICD-10-CM | POA: Insufficient documentation

## 2016-08-10 DIAGNOSIS — J449 Chronic obstructive pulmonary disease, unspecified: Secondary | ICD-10-CM | POA: Insufficient documentation

## 2016-09-10 NOTE — Progress Notes (Signed)
[facility] Consultation      Assessment and Plan:  Interstitial Lung Disease.  -Patient has rheumatoid arthritis, currently on methotrexate, which are both risk factors for interstitial lung disease. -We'll check a high-resolution CT chest.  Dilated esophagus/esophageal dysmotility/esophageal reflux. -Review above. Most recent CT chest shows a dilated esophagus with retained fluid. This could lead to aspiration. -Discussed reflux precautions change zantac to omeprazole. (developed nausea with protonix).  -Discussed measures to try to reduce reflux, such as not eating 4 hours before bedtime, trying to prop his head up with an extra pillow.  Cough-chronic -Discussed that the patient's chronic cough is likely symptom of another underlying chronic pathology. This could be reflux, or could be interstitial changes in his lungs. -He feels that the inhalers are not helpful. Therefore, these can be stopped. -We'll start the patient on Tessalon 200 mg 2-3 times daily. -He is asked to change his Mucinex to Mucinex-DM which includes a cough suppressant.    Date: 09/10/2016  MRN# [id number] Randy Lopez 10-11-1931  Referring Physician:   IWAN AMAN is a 82 y.o. old male seen in consultation for chief complaint of:    Chief Complaint  Patient presents with  . pulmonary consult    self ref. pt c/o prod cough with yellow mucus, cough hasn't improved with abx or prednisone. pt c/o sob with exertion & wheezing.    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 He is referred today due to symptoms of persistent cough.   Productive of some clear mucus. No wheezing. Tried nasal steroid in the past, which improved some allergic rhinitis symptoms but did not seem to help his cough The cough started as a cold. He  has undergone endoscopy, and is not having reflux symptoms. Had evidence of duodenitis. Pantoprazole was started, but he feels like this gives him more nausea.  He remains on methotrexate for rheumatoid arthritis; no progression of rheumatoid symptoms that he has noticed.  He was on tried on PPi in the past and helped with reflux symptoms.   He describes the cough as the same as a few months ago, he thinks it gets better at some times and worse at others.   He has been on albuterol and spiriva and have not helped at all. He has tried a prednisone taper which did not help. There has not been anything that helped. Cough does not vary much, maybe a bit worse outside, and in church. The cough does not wake him from sleep, is not worse when laying down.  He has been on remicaide for RA, and is still on methotrexate 15 mg weekly, down from 22.5 mg weekly before remicaide.   Review of CT from 06/21/14; There is very mild subcarinal and right hilar lymphadenopathy. There is a dilated esophagus that is full of fluid/contrast. There is mild sub-pleural interstitial prominence.   He underwent a esophageal stretching about 3 months ago.   CXR images 06/06/14; shows mild interstitial prominence.  Barium swallow on 05/23/16; showed esophageal dysmotility.     PMHX:   Past Medical History:  Diagnosis Date  . Acute respiratory failure ([place])   . Anemia, unspecified   . Arrhythmia    A-Fib  . BPH (benign prostatic hyperplasia)   . Chronic kidney disease   . Coronary atherosclerosis of native coronary artery   .  Dupuytren's contracture   . ED (erectile dysfunction)   . Essential hypertension, benign   . Other and unspecified hyperlipidemia   . Rheumatoid arthritis(714.0)   . Sleep apnea   . Trigger finger (acquired)   . Type II or unspecified type diabetes mellitus without mention of complication, not stated as uncontrolled    Surgical Hx:  Past Surgical History:  Procedure Laterality Date  .  ESOPHAGOGASTRODUODENOSCOPY (EGD) WITH PROPOFOL N/A 05/30/2016   Procedure: ESOPHAGOGASTRODUODENOSCOPY (EGD) WITH PROPOFOL;  Surgeon: Manya Silvas, MD;  Location: [facility] ENDOSCOPY;  Service: Endoscopy;  Laterality: N/A;  . INSERT / REPLACE / REMOVE PACEMAKER     Family Hx:  No family history on file. Social Hx:   Social History  Substance Use Topics  . Smoking status: Former Smoker    Years: 30.00    Types: Cigarettes    Quit date: 02/04/1981  . Smokeless tobacco: Never Used  . Alcohol use No   Medication:    Reviewed.   Allergies:  Flomax [tamsulosin hcl] and Lipitor [atorvastatin calcium]  Review of Systems: Gen:  Denies  fever, sweats, chills HEENT: Denies blurred vision, double vision. bleeds, sore throat Cvc:  No dizziness, chest pain. Resp:   Denies cough or sputum production, shortness of breath Gi: Denies swallowing difficulty, stomach pain. Gu:  Denies bladder incontinence, burning urine Ext:   No Joint pain, stiffness. Skin: No skin rash,  hives  Endoc:  No polyuria, polydipsia. Psych: No depression, insomnia. Other:  All other systems were reviewed with the patient and were negative other that what is mentioned in the HPI.   Physical Examination:   VS: BP 130/68 (BP Location: Left Arm, Cuff Size: Normal)   Pulse 81   Ht 6\' 2"  (1.88 m)   Wt 232 lb (105.2 kg)   SpO2 95%   BMI 29.79 kg/m   General Appearance: No distress  Neuro:without focal findings,  speech normal,  HEENT: PERRLA, EOM intact.   Pulmonary: normal breath sounds, No wheezing.  CardiovascularNormal S1,S2.  No m/r/g.   Abdomen: Benign, Soft, non-tender. Renal:  No costovertebral tenderness  GU:  No performed at this time. Endoc: No evident thyromegaly, no signs of acromegaly. Skin:   warm, no rashes, no ecchymosis  Extremities: normal, no cyanosis, clubbing.  Other findings:    LABORATORY PANEL:   CBC No results for input(s): WBC, HGB, HCT, PLT in the last 168  hours. ------------------------------------------------------------------------------------------------------------------  Chemistries  No results for input(s): NA, K, CL, CO2, GLUCOSE, BUN, CREATININE, CALCIUM, MG, AST, ALT, ALKPHOS, BILITOT in the last 168 hours.  Invalid input(s): GFRCGP ------------------------------------------------------------------------------------------------------------------  Cardiac Enzymes No results for input(s): TROPONINI in the last 168 hours. ------------------------------------------------------------  RADIOLOGY:  No results found.     Thank  you for the consultation and for allowing [facility] to assist in the care of your patient. Our recommendations are noted above.  Please contact us if we can be of further service.   Marda Stalker, MD.  Board Certified in Internal Medicine, Pulmonary Medicine, [place], and Sleep Medicine.  [facility] Office Number: [phone]  Patricia Pesa, M.D.  Vilinda Boehringer, M.D.  Merton Border, M.D  09/10/2016

## 2016-09-11 ENCOUNTER — Encounter: Payer: Self-pay | Admitting: Internal Medicine

## 2016-09-11 ENCOUNTER — Ambulatory Visit (INDEPENDENT_AMBULATORY_CARE_PROVIDER_SITE_OTHER): Payer: Medicare Other | Admitting: Internal Medicine

## 2016-09-11 VITALS — BP 130/68 | HR 81 | Ht 74.0 in | Wt 232.0 lb

## 2016-09-11 DIAGNOSIS — R05 Cough: Secondary | ICD-10-CM | POA: Diagnosis not present

## 2016-09-11 DIAGNOSIS — R059 Cough, unspecified: Secondary | ICD-10-CM

## 2016-09-11 DIAGNOSIS — R0600 Dyspnea, unspecified: Secondary | ICD-10-CM

## 2016-09-11 DIAGNOSIS — R0602 Shortness of breath: Secondary | ICD-10-CM | POA: Diagnosis not present

## 2016-09-11 MED ORDER — BENZONATATE 200 MG PO CAPS: 200.0000 mg | ORAL_CAPSULE | Freq: Three times a day (TID) | ORAL | 1 refills | Status: DC

## 2016-09-11 NOTE — Patient Instructions (Addendum)
--  Send for CT chest hi-resolution.   --Start tessalon 200 mg 2 to 3 times daily.   --Change mucinex to mucinex-DM.   --Stop zantac, start omeprezole (prilosec) 20 mg once daily.   --Prop your head up with 2 pillows, do not eat for 4 hours before bedtime.   --Stop spiriva and albuterol inhalers if not helping with cough.

## 2016-09-18 ENCOUNTER — Encounter: Payer: Self-pay | Admitting: Urology

## 2016-09-18 ENCOUNTER — Ambulatory Visit (INDEPENDENT_AMBULATORY_CARE_PROVIDER_SITE_OTHER): Payer: Medicare Other | Admitting: Urology

## 2016-09-18 VITALS — BP 126/70 | HR 66 | Ht 73.0 in | Wt 231.7 lb

## 2016-09-18 DIAGNOSIS — N400 Enlarged prostate without lower urinary tract symptoms: Secondary | ICD-10-CM | POA: Diagnosis not present

## 2016-09-18 LAB — BLADDER SCAN AMB NON-IMAGING: SCAN RESULT: 12

## 2016-09-18 MED ORDER — FINASTERIDE 5 MG PO TABS: 5.0000 mg | ORAL_TABLET | Freq: Every day | ORAL | 3 refills | Status: AC

## 2016-09-18 NOTE — Progress Notes (Signed)
09/18/2016 3:51 PM   Randy Lopez Randy Lopez 1931-09-05 [id number]  Referring provider: Adin Hector, MD [address], [place] [address]  Chief Complaint  Patient presents with  . Follow-up    BPH    HPI:  1 - Enlarged Prostate with Lower Urinary Tract Symptoms - s/p prostate vaporixation 2005 and 2010 by [facility] for mix of obstructive and irritative symptoms. Has has slowly progressive return of symptoms, mostly with urgency / freqeuncy. DRE 08/2016 70gm assymetric Lt > Rt, no nodules. Gets nasal congestion with alpha blockers per report. PVR 08/2016 "73mL". Reports negative prostate biopsy as well around 2010 (at age 38).  Starting finasteride trial 08/2016.   Today "Randy Lopez" is seen in f/u above.    PMH: Past Medical History:  Diagnosis Date  . Acute respiratory failure ([place])   . Anemia, unspecified   . Arrhythmia    A-Fib  . BPH (benign prostatic hyperplasia)   . Chronic kidney disease   . Coronary atherosclerosis of native coronary artery   . Dupuytren's contracture   . ED (erectile dysfunction)   . Essential hypertension, benign   . Other and unspecified hyperlipidemia   . Rheumatoid arthritis(714.0)   . Sleep apnea   . Trigger finger (acquired)   . Type II or unspecified type diabetes mellitus without mention of complication, not stated as uncontrolled     Surgical History: Past Surgical History:  Procedure Laterality Date  . ESOPHAGOGASTRODUODENOSCOPY (EGD) WITH PROPOFOL N/A 05/30/2016   Procedure: ESOPHAGOGASTRODUODENOSCOPY (EGD) WITH PROPOFOL;  Surgeon: Manya Silvas, MD;  Location: [facility] ENDOSCOPY;  Service: Endoscopy;  Laterality: N/A;  . INSERT / REPLACE / REMOVE PACEMAKER      Home Medications:    Medication List       Accurate as of 09/18/16  3:51 PM. Always use your most recent med list.          amLODipine 5 MG tablet Commonly known as:  NORVASC Take 5 mg by mouth daily.   aspirin 81 MG tablet Take 81 mg by  mouth daily.   benzonatate 200 MG capsule Commonly known as:  TESSALON Take 1 capsule (200 mg total) by mouth 3 (three) times daily.   folic acid 1 MG tablet Commonly known as:  FOLVITE Take 1 mg by mouth daily.   glimepiride 4 MG tablet Commonly known as:  AMARYL Take 4 mg by mouth daily before breakfast.   losartan 100 MG tablet Commonly known as:  COZAAR Take by mouth.   methotrexate 2.5 MG tablet Take 2.5 mg by mouth. Take 6 tablets by mouth once a week.   PROAIR HFA 108 (90 Base) MCG/ACT inhaler Generic drug:  albuterol   ranitidine 150 MG tablet Commonly known as:  ZANTAC Take by mouth.   REMICADE 100 MG injection Generic drug:  inFLIXimab Inject 100 mg into the vein every 8 (eight) weeks.   Vitamin D3 1000 units Caps Take 1,000 Units by mouth 1 day or 1 dose.       Allergies:  Allergies  Allergen Reactions  . Atorvastatin     Other reaction(s): Muscle Pain  . Flomax [Tamsulosin Hcl]   . Lipitor [Atorvastatin Calcium]   . Tamsulosin     Other reaction(s): Other (See Comments) Nasal congestion    Family History: Family History  Problem Relation Age of Onset  . Diabetes Mother   . Heart Problems Father     Social History:  reports that he quit smoking  about 35 years ago. His smoking use included Cigarettes. He has a 30.00 pack-year smoking history. He has never used smokeless tobacco. He reports that he does not drink alcohol or use drugs.  ROS: UROLOGY Frequent Urination?: Yes Hard to postpone urination?: No Burning/pain with urination?: No Get up at night to urinate?: No Leakage of urine?: Yes Urine stream starts and stops?: No Trouble starting stream?: No Do you have to strain to urinate?: No Blood in urine?: No Urinary tract infection?: No Sexually transmitted disease?: No Injury to kidneys or bladder?: No Painful intercourse?: No Weak stream?: Yes Erection problems?: No Penile pain?: No  Gastrointestinal Nausea?: No Vomiting?:  No Indigestion/heartburn?: No Diarrhea?: No Constipation?: Yes  Constitutional Fever: No Night sweats?: No Weight loss?: No Fatigue?: No  Skin Skin rash/lesions?: No Itching?: No  Eyes Blurred vision?: No Double vision?: No  Ears/Nose/Throat Sore throat?: No Sinus problems?: No  Hematologic/Lymphatic Swollen glands?: No Easy bruising?: No  Cardiovascular Leg swelling?: No Chest pain?: No  Respiratory Cough?: Yes Shortness of breath?: Yes  Endocrine Excessive thirst?: No  Musculoskeletal Back pain?: No Joint pain?: No  Neurological Headaches?: No Dizziness?: Yes  Psychologic Depression?: No Anxiety?: No  Physical Exam: BP 126/70   Pulse 66   Ht 6\' 1"  (1.854 m)   Wt 105.1 kg (231 lb 11.2 oz)   BMI 30.57 kg/m   Constitutional:  Alert and oriented, No acute distress. HEENT: [place] AT, moist mucus membranes.  Trachea midline, no masses. Cardiovascular: No clubbing, cyanosis, or edema. Respiratory: Normal respiratory effort, no increased work of breathing. GI: Abdomen is soft, nontender, nondistended, no abdominal masses GU: No CVA tenderness. Partially burried penis. Retractiel foreskin. 70gm smooth prostate with some assymetry Lt > Rt. Skin: No rashes, bruises or suspicious lesions. Lymph: No cervical or inguinal adenopathy. Neurologic: Grossly intact, no focal deficits, moving all 4 extremities. Psychiatric: Normal mood and affect.  Laboratory Data: Lab Results  Component Value Date   WBC 4.9 06/09/2014   HGB 11.6 (L) 06/09/2014   HCT 33.2 (L) 06/09/2014   MCV 90 06/09/2014   PLT 168 06/09/2014    Lab Results  Component Value Date   CREATININE 1.37 (H) 06/09/2014    No results found for: PSA  No results found for: TESTOSTERONE  No results found for: HGBA1C  Urinalysis    Component Value Date/Time   COLORURINE Yellow 06/06/2014 0122   APPEARANCEUR Clear 06/06/2014 0122   LABSPEC 1.016 06/06/2014 0122   PHURINE 5.0 06/06/2014 0122     GLUCOSEU Negative 06/06/2014 0122   HGBUR Negative 06/06/2014 0122   BILIRUBINUR Negative 06/06/2014 0122   KETONESUR Negative 06/06/2014 0122   PROTEINUR Negative 06/06/2014 0122   NITRITE Negative 06/06/2014 0122   LEUKOCYTESUR Negative 06/06/2014 0122      Assessment & Plan:   1 - Enlarged Prostate with Lower Urinary Tract Symptoms - likely slow regrowth of BPH. Given palpably large gland, discussed trial of finastride v. observatoin and he opts for finasteirde tiral. Time course to improvement (months) discussed.  Sent [place] today.  RTC 1 year.   No Follow-up on file.  Alexis Frock, [facility] [address] [place], [place] [address] [phone]

## 2016-10-15 DIAGNOSIS — I420 Dilated cardiomyopathy: Secondary | ICD-10-CM | POA: Insufficient documentation

## 2016-10-24 ENCOUNTER — Ambulatory Visit: Payer: Medicare Other

## 2016-10-29 ENCOUNTER — Ambulatory Visit: Payer: Medicare Other | Admitting: Internal Medicine

## 2017-08-09 IMAGING — RF DG ESOPHAGUS
10 series · 14 of 17 positions shown · non-contrast
Comparison: None.

CLINICAL DATA: Dysphagia.

EXAM:
ESOPHOGRAM / BARIUM SWALLOW
TECHNIQUE: Combined double contrast and single contrast examination performed
using thick barium liquid, and thin barium liquid.
FLUOROSCOPY TIME:  Radiation Exposure Index (as provided by the
fluoroscopic device): 38.7 mGy

[Series 1: fluoro_barium 2fps_bw · 0.18mm/px · 2 of 8 frames shown (1 of 10)]
[frame 2/8]
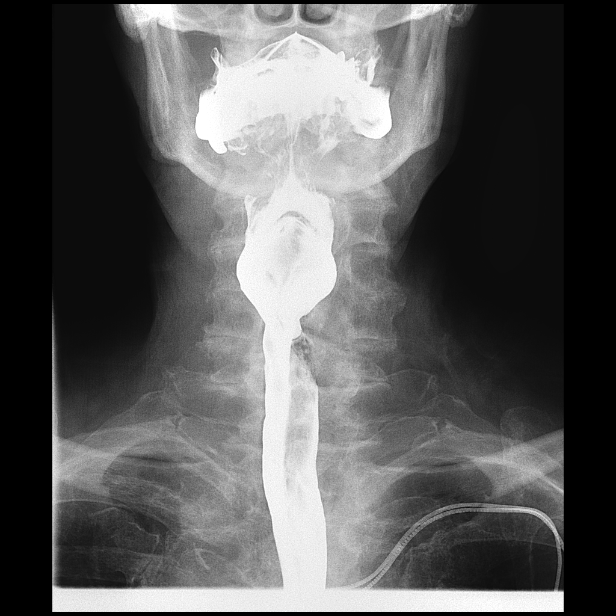
[frame 5/8]
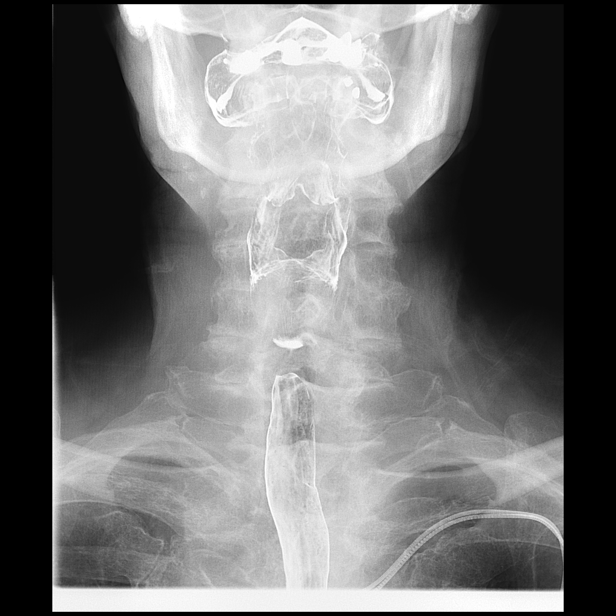

[Series 2: fluoro_barium 2fps_bw · 0.18mm/px · 3 of 7 frames shown (2 of 10)]
[frame 2/7]
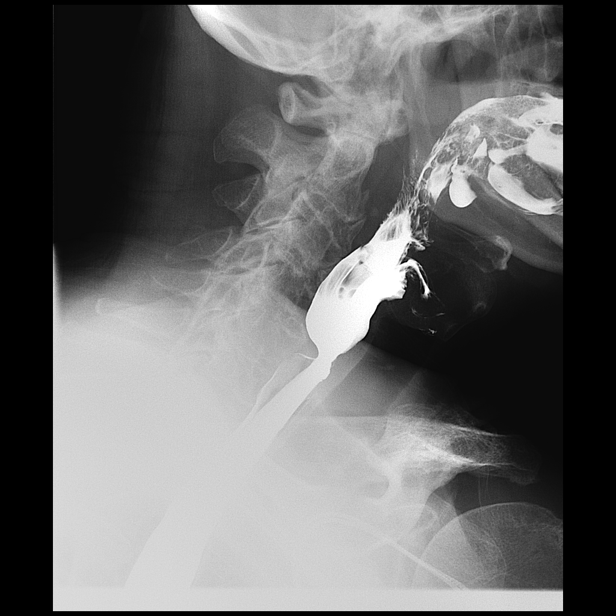
[frame 4/7]
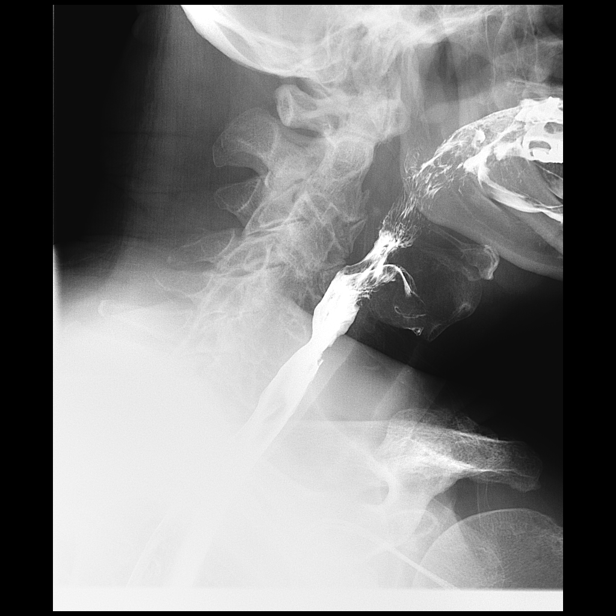
[frame 6/7]
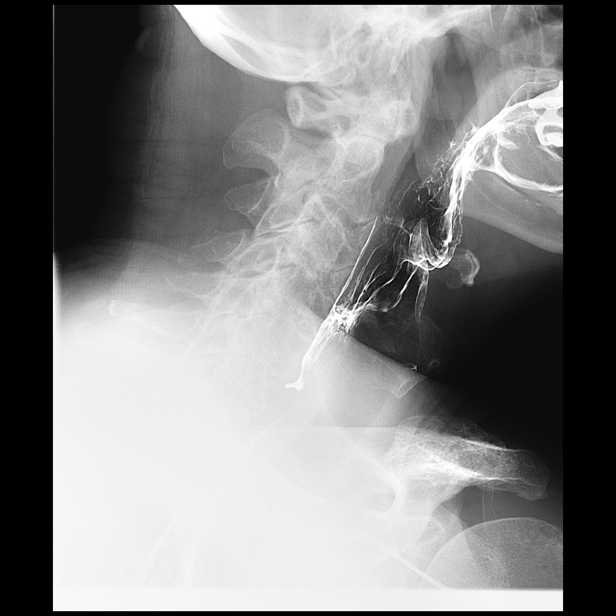

[Series 3: fluoro_barium 2fps_bw · 0.18mm/px · 2 of 5 frames shown (3 of 10)]
[frame 1/5]
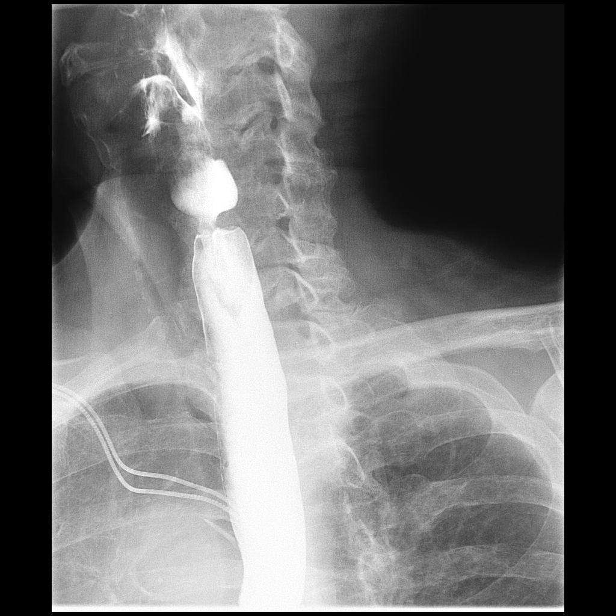
[frame 3/5]
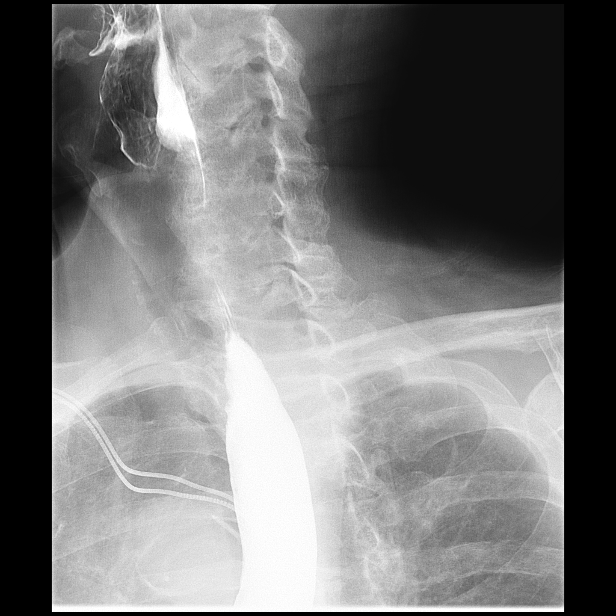

[Series 4: fluoro_barium 2fps_bw · 0.18mm/px · 1 of 1 slices shown (4 of 10)]
[im 1/1]
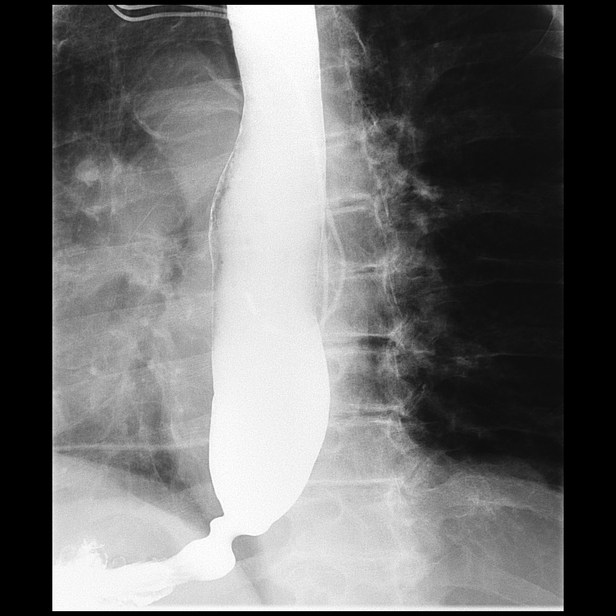

[Series 5: fluoro_barium 2fps_bw · 0.18mm/px · 1 of 1 slices shown (5 of 10)]
[im 1/1]
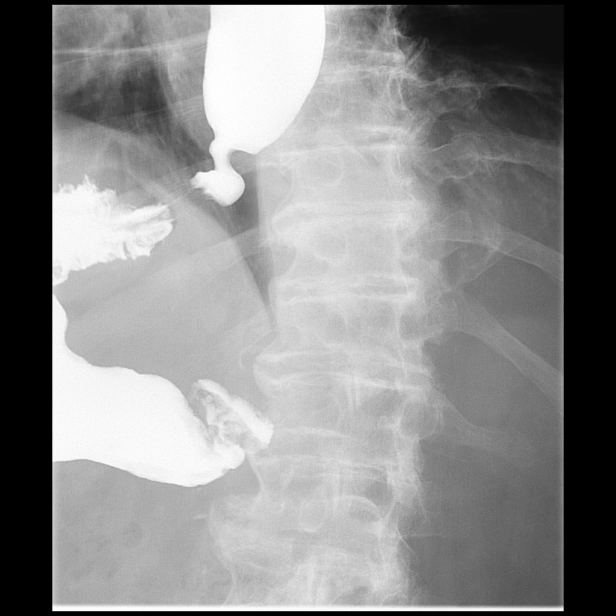

[Series 6: fluoro_barium 2fps_bw · 0.18mm/px · 1 of 1 slices shown (6 of 10)]
[im 1/1]
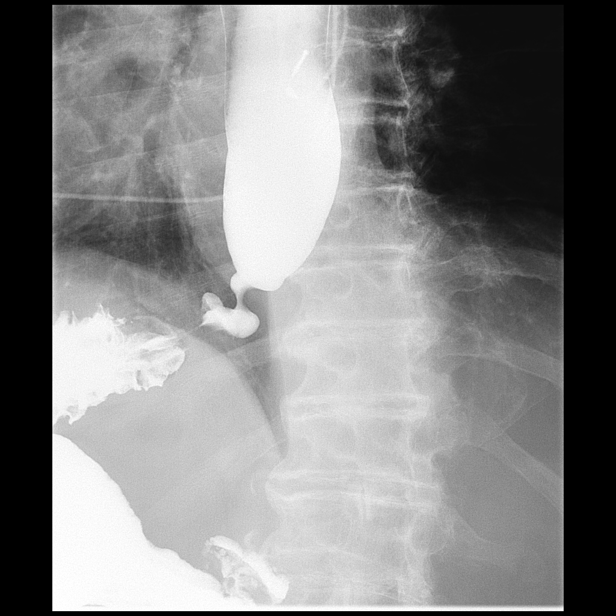

[Series 7: fluoro_barium 2fps_bw · 0.18mm/px · 1 of 1 slices shown (7 of 10)]
[im 1/1]
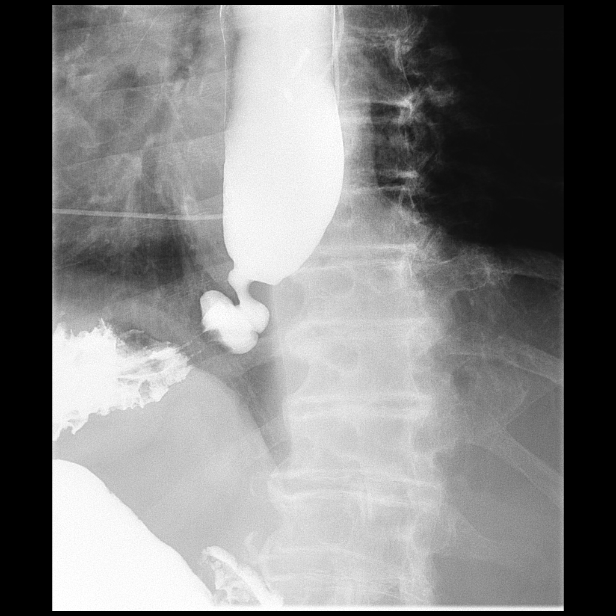

[Series 8: fluoro_barium 2fps_bw · 0.18mm/px · 1 of 1 slices shown (8 of 10)]
[im 1/1]
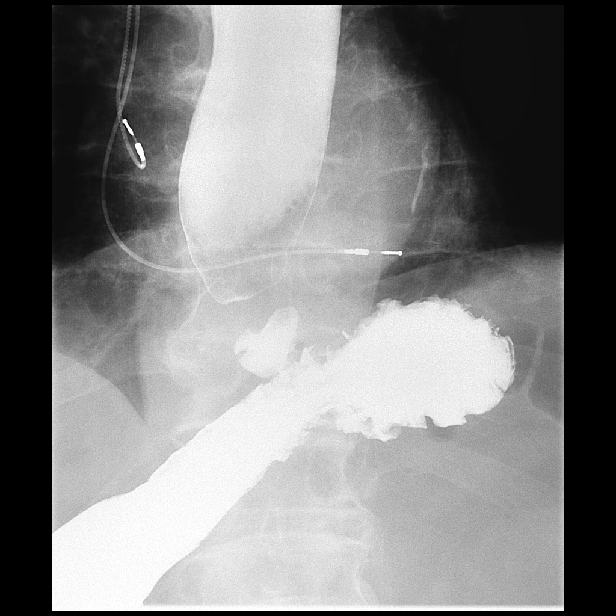

[Series 9: fluoro_barium 2fps_bw · 0.18mm/px · 1 of 2 frames shown (9 of 10)]
[frame 2/2]
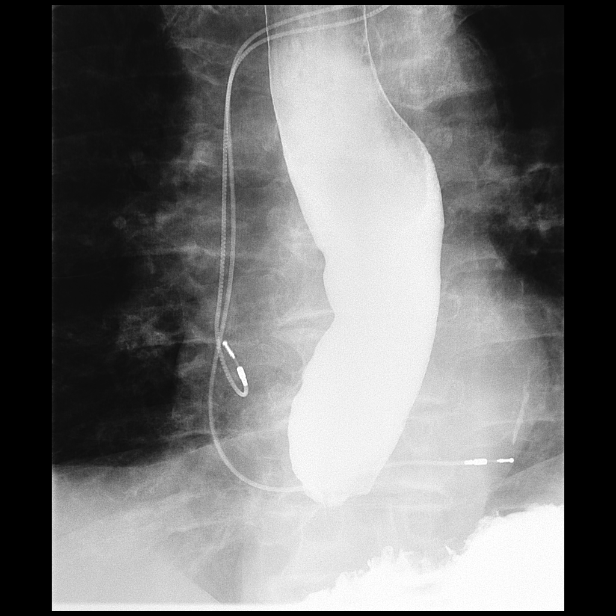

[Series 10: fluoro_barium 2fps_bw · 0.18mm/px · 1 of 1 slices shown (10 of 10)]
[im 1/1]
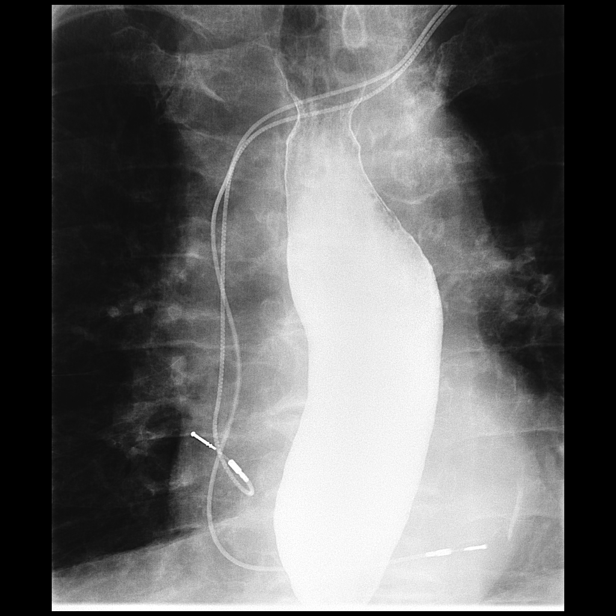

[14 of 17 positions shown; findings below may reference images not displayed]

FINDINGS: Proximal focal narrowing of the mid cervical esophagus noted.
Narrowing is smooth. This could represent a focal stricture.
Prominent smooth narrowing is noted of the distal esophagus at the
gastroesophageal junction with prominent dilatation of the thoracic
esophagus. Poor esophageal motility noted . These findings suggest
possibility of achalasia. A distal esophageal benign or malignant
stricture could also present in this fashion. Given the presence of
the prominent strictures a barium tablet was not administered .
IMPRESSION: 1.  Mid cervical esophageal smooth stricture.

2. Prominent narrowing of the distal esophagus at the
gastroesophageal junction with prominent dilatation of the thoracic
esophagus. Poor esophageal motility noted. These findings suggests
the possibility of achalasia. A distal esophageal benign or
malignant stricture could also present in this fashion .

## 2017-09-17 ENCOUNTER — Ambulatory Visit: Payer: Medicare Other

## 2017-09-19 ENCOUNTER — Telehealth: Payer: Self-pay

## 2017-09-19 ENCOUNTER — Ambulatory Visit (INDEPENDENT_AMBULATORY_CARE_PROVIDER_SITE_OTHER): Payer: Medicare Other | Admitting: Urology

## 2017-09-19 ENCOUNTER — Encounter: Payer: Self-pay | Admitting: Urology

## 2017-09-19 VITALS — BP 121/70 | HR 84 | Ht 73.0 in | Wt 231.6 lb

## 2017-09-19 DIAGNOSIS — N401 Enlarged prostate with lower urinary tract symptoms: Secondary | ICD-10-CM

## 2017-09-19 DIAGNOSIS — N400 Enlarged prostate without lower urinary tract symptoms: Secondary | ICD-10-CM

## 2017-09-19 MED ORDER — FINASTERIDE 5 MG PO TABS: 5.0000 mg | ORAL_TABLET | Freq: Every day | ORAL | 11 refills | Status: DC

## 2017-09-19 MED ORDER — ALFUZOSIN HCL ER 10 MG PO TB24: 10.0000 mg | ORAL_TABLET | Freq: Every day | ORAL | 11 refills | Status: DC

## 2017-09-19 NOTE — Telephone Encounter (Signed)
Wife of pt called stating Dr. Pilar Jarvis changed the medication. Wife stated that insurance will not cover the new medication so pt wants to go back to finasteride. Refills were given.

## 2017-09-19 NOTE — Progress Notes (Signed)
09/19/2017 1:37 PM   Randy Lopez November 30, 1931 [id number]  Referring provider: Adin Hector, MD [facility] [place], [place] [address]  Chief Complaint  Patient presents with  . Benign Prostatic Hypertrophy    HPI: 1 - Enlarged Prostate with Lower Urinary Tract Symptoms - s/p prostate vaporization 2005 and 2010 by [facility] for mix of obstructive and irritative symptoms. Has has slowly progressive return of symptoms, mostly with urgency / freqeuncy. DRE 08/2016 70gm assymetric Lt > Rt, no nodules. Gets nasal congestion with alpha blockers per report. PVR 08/2016 "35mL". Reports negative prostate biopsy as well around 2010 (at age 55).  Started finasteride trial 08/2016.   He has a slight improvement. He still notes nocturia 2. Also has minor tripping. He also notes daytime frequency. He feels that his urinary quality life is 7 out of 10 rating. He is interested in trying another medication if this may help him.   PMH: Past Medical History:  Diagnosis Date  . Acute respiratory failure ([place])   . Anemia, unspecified   . Arrhythmia    A-Fib  . BPH (benign prostatic hyperplasia)   . Chronic kidney disease   . Coronary atherosclerosis of native coronary artery   . Dupuytren's contracture   . ED (erectile dysfunction)   . Essential hypertension, benign   . Other and unspecified hyperlipidemia   . Rheumatoid arthritis(714.0)   . Sleep apnea   . Trigger finger (acquired)   . Type II or unspecified type diabetes mellitus without mention of complication, not stated as uncontrolled     Surgical History: Past Surgical History:  Procedure Laterality Date  . ESOPHAGOGASTRODUODENOSCOPY (EGD) WITH PROPOFOL N/A 05/30/2016   Procedure: ESOPHAGOGASTRODUODENOSCOPY (EGD) WITH PROPOFOL;  Surgeon: Manya Silvas, MD;  Location: [facility] ENDOSCOPY;  Service: Endoscopy;  Laterality: N/A;  . INSERT / REPLACE / REMOVE PACEMAKER      Home Medications:  Allergies as of  09/19/2017      Reactions   Atorvastatin    Other reaction(s): Muscle Pain   Flomax [tamsulosin Hcl]    Lipitor [atorvastatin Calcium]    Tamsulosin    Other reaction(s): Other (See Comments) Nasal congestion      Medication List       Accurate as of 09/19/17  1:37 PM. Always use your most recent med list.          alfuzosin 10 MG 24 hr tablet Commonly known as:  UROXATRAL Take 1 tablet (10 mg total) by mouth daily with breakfast.   amLODipine 5 MG tablet Commonly known as:  NORVASC Take 5 mg by mouth daily.   aspirin 81 MG tablet Take 81 mg by mouth daily.   finasteride 5 MG tablet Commonly known as:  PROSCAR Take 5 mg by mouth daily.   folic acid 1 MG tablet Commonly known as:  FOLVITE Take 1 mg by mouth daily.   glimepiride 4 MG tablet Commonly known as:  AMARYL Take 4 mg by mouth daily before breakfast.   losartan 100 MG tablet Commonly known as:  COZAAR Take by mouth.   methotrexate 2.5 MG tablet Take 2.5 mg by mouth. Take 6 tablets by mouth once a week.   PROAIR HFA 108 (90 Base) MCG/ACT inhaler Generic drug:  albuterol   ranitidine 150 MG tablet Commonly known as:  ZANTAC Take by mouth.   REMICADE 100 MG injection Generic drug:  inFLIXimab Inject 100 mg into the vein every 8 (eight) weeks.  Vitamin D3 1000 units Caps Take 1,000 Units by mouth 1 day or 1 dose.            Discharge Care Instructions        Start     Ordered   09/19/17 0000  alfuzosin (UROXATRAL) 10 MG 24 hr tablet  Daily with breakfast     09/19/17 1336      Allergies:  Allergies  Allergen Reactions  . Atorvastatin     Other reaction(s): Muscle Pain  . Flomax [Tamsulosin Hcl]   . Lipitor [Atorvastatin Calcium]   . Tamsulosin     Other reaction(s): Other (See Comments) Nasal congestion    Family History: Family History  Problem Relation Age of Onset  . Diabetes Mother   . Heart Problems Father     Social History:  reports that he quit smoking about 36  years ago. His smoking use included Cigarettes. He has a 30.00 pack-year smoking history. He has never used smokeless tobacco. He reports that he does not drink alcohol or use drugs.  ROS: UROLOGY Frequent Urination?: Yes Hard to postpone urination?: No Burning/pain with urination?: No Get up at night to urinate?: No Leakage of urine?: Yes Urine stream starts and stops?: No Trouble starting stream?: No Do you have to strain to urinate?: No Blood in urine?: No Urinary tract infection?: No Sexually transmitted disease?: No Injury to kidneys or bladder?: No Painful intercourse?: No Weak stream?: No Erection problems?: No Penile pain?: No  Gastrointestinal Nausea?: No Vomiting?: No Indigestion/heartburn?: No Diarrhea?: No Constipation?: No  Constitutional Fever: No Night sweats?: No Weight loss?: No Fatigue?: No  Skin Skin rash/lesions?: No Itching?: No  Eyes Blurred vision?: No Double vision?: No  Ears/Nose/Throat Sore throat?: No Sinus problems?: No  Hematologic/Lymphatic Swollen glands?: No Easy bruising?: No  Cardiovascular Leg swelling?: No Chest pain?: No  Respiratory Cough?: No Shortness of breath?: No  Endocrine Excessive thirst?: No  Musculoskeletal Back pain?: No Joint pain?: No  Neurological Headaches?: No Dizziness?: No  Psychologic Depression?: No Anxiety?: No  Physical Exam: BP 121/70 (BP Location: Right Arm, Patient Position: Sitting, Cuff Size: Normal)   Pulse 84   Ht 6\' 1"  (1.854 m)   Wt 231 lb 9.6 oz (105.1 kg)   BMI 30.56 kg/m   Constitutional:  Alert and oriented, No acute distress. HEENT: [place] AT, moist mucus membranes.  Trachea midline, no masses. Cardiovascular: No clubbing, cyanosis, or edema. Respiratory: Normal respiratory effort, no increased work of breathing. GI: Abdomen is soft, nontender, nondistended, no abdominal masses GU: No CVA tenderness.  Skin: No rashes, bruises or suspicious lesions. Lymph: No  cervical or inguinal adenopathy. Neurologic: Grossly intact, no focal deficits, moving all 4 extremities. Psychiatric: Normal mood and affect.  Laboratory Data: Lab Results  Component Value Date   WBC 4.9 06/09/2014   HGB 11.6 (L) 06/09/2014   HCT 33.2 (L) 06/09/2014   MCV 90 06/09/2014   PLT 168 06/09/2014    Lab Results  Component Value Date   CREATININE 1.37 (H) 06/09/2014    No results found for: PSA  No results found for: TESTOSTERONE  No results found for: HGBA1C  Urinalysis    Component Value Date/Time   COLORURINE Yellow 06/06/2014 0122   APPEARANCEUR Clear 06/06/2014 0122   LABSPEC 1.016 06/06/2014 0122   PHURINE 5.0 06/06/2014 0122   GLUCOSEU Negative 06/06/2014 0122   HGBUR Negative 06/06/2014 0122   BILIRUBINUR Negative 06/06/2014 0122   KETONESUR Negative 06/06/2014 0122   PROTEINUR  Negative 06/06/2014 0122   NITRITE Negative 06/06/2014 0122   LEUKOCYTESUR Negative 06/06/2014 0122     Assessment & Plan:   1 - Enlarged Prostate with Lower Urinary Tract Symptoms - continue finasteride. We will add Uroxatral. Patient was warned that he may also have nasal congestion with this medication as he did with Flomax. If this were to occur, he should stop this medication. He'll follow-up annually.  Return in about 1 year (around 09/19/2018).  Nickie Retort, MD  [facility] [address], [place], [place] [address] [phone]

## 2018-01-16 ENCOUNTER — Other Ambulatory Visit: Payer: Self-pay

## 2018-01-16 DIAGNOSIS — N401 Enlarged prostate with lower urinary tract symptoms: Secondary | ICD-10-CM

## 2018-01-16 MED ORDER — FINASTERIDE 5 MG PO TABS: 5.0000 mg | ORAL_TABLET | Freq: Every day | ORAL | 3 refills | Status: DC

## 2018-09-17 ENCOUNTER — Encounter: Payer: Self-pay | Admitting: Urology

## 2018-09-17 ENCOUNTER — Ambulatory Visit (INDEPENDENT_AMBULATORY_CARE_PROVIDER_SITE_OTHER): Payer: Medicare Other | Admitting: Urology

## 2018-09-17 VITALS — BP 155/65 | HR 80 | Resp 16 | Ht 73.0 in | Wt 229.3 lb

## 2018-09-17 DIAGNOSIS — N401 Enlarged prostate with lower urinary tract symptoms: Secondary | ICD-10-CM | POA: Diagnosis not present

## 2018-09-17 MED ORDER — FINASTERIDE 5 MG PO TABS: 5.0000 mg | ORAL_TABLET | Freq: Every day | ORAL | 3 refills | Status: DC

## 2018-09-17 NOTE — Progress Notes (Signed)
   09/17/2018 2:58 PM   Randy Lopez January 07, 1931 [id number]  Reason for visit: Follow up BPH/LUTS  HPI: I had the pleasure of seeing Randy Lopez in urology clinic today in follow-up for BPH/LUTS.  He was previously a patient of Dr. Pilar Jarvis.  He is an 84 year old male with a history of PVP in 2005 and 2010 with Dr. Bernardo Heater.  He is currently very well managed on finasteride alone.  He has not tolerated alpha blockers in the past secondary to nasal congestion.  He had a negative prostate biopsy in 2010 at age 58.  He reports his primary symptom is nocturia 1-2 times per night.  There are no aggravating or alleviating factors.  Severity is mild.  Duration is chronic.   ROS: Please see flowsheet from today's date for complete review of systems.  Physical Exam: BP (!) 155/65   Pulse 80   Resp 16   Ht 6\' 1"  (1.854 m)   Wt 229 lb 4.8 oz (104 kg)   BMI 30.25 kg/m    Constitutional:  Alert and oriented, No acute distress. Respiratory: Normal respiratory effort, no increased work of breathing. GI: Abdomen is soft, nontender, nondistended, no abdominal masses GU: No CVA tenderness Skin: No rashes, bruises or suspicious lesions. Neurologic: Grossly intact, no focal deficits, moving all 4 extremities. Psychiatric: Normal mood and affect  Assessment & Plan:   In summary, Randy Lopez is an 84 year old male with very well managed urinary symptoms on finasteride.  He has a history of PVP x2 in 2005 and 2010.  He is past the age of PSA screening per AUA guidelines.  He would like to continue medical management with finasteride.  We discussed behavioral modifications including timed voiding, double voiding prior to bed, and minimizing fluids in the evenings to improve his nocturia.  Finasteride prescription was refilled today.  Follow-up in 1 year with PVR  Return in about 1 year (around 09/18/2019) for PVR.  Billey Co, [facility] [address], [place],  [address] [phone]

## 2018-09-18 LAB — MICROSCOPIC EXAMINATION
EPITHELIAL CELLS (NON RENAL): NONE SEEN /HPF (ref 0–10)
WBC UA: NONE SEEN /HPF (ref 0–5)

## 2018-09-18 LAB — URINALYSIS, COMPLETE
BILIRUBIN UA: NEGATIVE
Glucose, UA: NEGATIVE
Ketones, UA: NEGATIVE
LEUKOCYTES UA: NEGATIVE
Nitrite, UA: NEGATIVE
PROTEIN UA: NEGATIVE
RBC UA: NEGATIVE
Specific Gravity, UA: 1.02 (ref 1.005–1.030)
Urobilinogen, Ur: 1 mg/dL (ref 0.2–1.0)
pH, UA: 5.5 (ref 5.0–7.5)

## 2018-09-19 ENCOUNTER — Ambulatory Visit: Payer: Medicare Other

## 2018-09-26 ENCOUNTER — Telehealth: Payer: Self-pay | Admitting: Urology

## 2018-09-26 NOTE — Telephone Encounter (Signed)
Can you please take [facility] off of [facility] under snapshot?

## 2018-09-26 NOTE — Telephone Encounter (Signed)
Done

## 2018-12-30 ENCOUNTER — Telehealth: Payer: Self-pay | Admitting: Urology

## 2018-12-30 DIAGNOSIS — N401 Enlarged prostate with lower urinary tract symptoms: Secondary | ICD-10-CM

## 2018-12-30 NOTE — Telephone Encounter (Signed)
Pt needs a refill for a 90 day supply to [facility].

## 2018-12-31 DIAGNOSIS — N400 Enlarged prostate without lower urinary tract symptoms: Secondary | ICD-10-CM | POA: Insufficient documentation

## 2018-12-31 DIAGNOSIS — E785 Hyperlipidemia, unspecified: Secondary | ICD-10-CM | POA: Insufficient documentation

## 2018-12-31 DIAGNOSIS — G4730 Sleep apnea, unspecified: Secondary | ICD-10-CM | POA: Insufficient documentation

## 2018-12-31 MED ORDER — FINASTERIDE 5 MG PO TABS: 5.0000 mg | ORAL_TABLET | Freq: Every day | ORAL | 2 refills | Status: DC

## 2018-12-31 NOTE — Telephone Encounter (Signed)
Spoke with [facility].  They did not have refill for finasteride on file.  New rx sent.  Patient aware.

## 2019-09-15 ENCOUNTER — Ambulatory Visit: Payer: Medicare Other | Admitting: Urology

## 2019-11-04 ENCOUNTER — Other Ambulatory Visit: Payer: Self-pay

## 2019-11-04 ENCOUNTER — Ambulatory Visit (INDEPENDENT_AMBULATORY_CARE_PROVIDER_SITE_OTHER): Payer: Medicare Other | Admitting: Urology

## 2019-11-04 ENCOUNTER — Encounter: Payer: Self-pay | Admitting: Urology

## 2019-11-04 VITALS — BP 173/75 | HR 81 | Ht 74.0 in | Wt 227.6 lb

## 2019-11-04 DIAGNOSIS — N401 Enlarged prostate with lower urinary tract symptoms: Secondary | ICD-10-CM

## 2019-11-04 LAB — BLADDER SCAN AMB NON-IMAGING

## 2019-11-04 MED ORDER — FINASTERIDE 5 MG PO TABS: 5.0000 mg | ORAL_TABLET | Freq: Every day | ORAL | 3 refills | Status: DC

## 2019-11-04 NOTE — Progress Notes (Signed)
   11/04/2019 8:43 AM   Lucifer [name] 1931/04/07 [id number]  Reason for visit: Follow up BPH  HPI: I saw Mr. Randy Lopez back in urology clinic for follow-up of urinary symptoms.  He is an 85 year old male with a history of PVP in 2005 in 2010 with Dr. Bernardo Heater.  His urinary symptoms are very well managed on finasteride alone.  He did not tolerate alpha blockers in the past.  He also had a negative prostate biopsy in 2010 at age 15.  His primary complaint is nocturia 1-2 times per night, and occasional frequency during the day.  He drinks primarily tea during the day.  He occasionally has some mild post void dribbling that is minimally bothersome.  Overall, he feels he is doing very well.  He denies any gross hematuria or UTIs.  PVR is 1 mL in clinic today.  We discussed behavioral strategies including minimizing fluids in the evening, double voiding prior to bed, and cutting back on tea.  Finasteride refilled  RTC 1 year with PVR   A total of 15 minutes were spent face-to-face with the patient, greater than 50% was spent in patient education, counseling, and coordination of care regarding BPH and urinary symptoms.  Billey Co, [facility] [address], [place], [place] [address] [phone]

## 2020-07-25 ENCOUNTER — Other Ambulatory Visit: Payer: Self-pay

## 2020-07-25 DIAGNOSIS — N401 Enlarged prostate with lower urinary tract symptoms: Secondary | ICD-10-CM

## 2020-07-25 MED ORDER — FINASTERIDE 5 MG PO TABS: 5.0000 mg | ORAL_TABLET | Freq: Every day | ORAL | 0 refills | Status: DC

## 2020-08-08 ENCOUNTER — Encounter: Payer: Self-pay | Admitting: Urology

## 2020-11-07 ENCOUNTER — Other Ambulatory Visit: Payer: Self-pay | Admitting: *Deleted

## 2020-11-07 ENCOUNTER — Ambulatory Visit: Payer: Medicare Other | Admitting: Urology

## 2020-11-07 DIAGNOSIS — N401 Enlarged prostate with lower urinary tract symptoms: Secondary | ICD-10-CM

## 2020-11-07 MED ORDER — FINASTERIDE 5 MG PO TABS: 5.0000 mg | ORAL_TABLET | Freq: Every day | ORAL | 1 refills | Status: DC

## 2020-11-09 ENCOUNTER — Encounter: Payer: Self-pay | Admitting: Urology

## 2020-11-09 ENCOUNTER — Ambulatory Visit (INDEPENDENT_AMBULATORY_CARE_PROVIDER_SITE_OTHER): Payer: Medicare Other | Admitting: Urology

## 2020-11-09 ENCOUNTER — Other Ambulatory Visit: Payer: Self-pay

## 2020-11-09 VITALS — BP 131/77 | HR 98 | Ht 72.0 in | Wt 232.8 lb

## 2020-11-09 DIAGNOSIS — N401 Enlarged prostate with lower urinary tract symptoms: Secondary | ICD-10-CM

## 2020-11-09 DIAGNOSIS — R102 Pelvic and perineal pain: Secondary | ICD-10-CM

## 2020-11-09 LAB — BLADDER SCAN AMB NON-IMAGING

## 2020-11-09 MED ORDER — FINASTERIDE 5 MG PO TABS: 5.0000 mg | ORAL_TABLET | Freq: Every day | ORAL | 1 refills | Status: DC

## 2020-11-09 NOTE — Progress Notes (Signed)
   11/09/2020 10:59 AM   Randy Lopez Apr 11, 1931 [id number]  Reason for visit: Follow up BPH, scrotal pain  HPI: [name] is a 86 year old male with a history of PVP in 2005 and in 2010 with Dr. Bernardo Heater.  His urinary symptoms have been well managed on finasteride alone at this time.  He did not tolerate alpha blockers in the past secondary to nasal congestion.  He also has a negative prostate biopsy in 2010 at age 57.  He reports no worsening of his urinary symptoms over the last year.  He will occasionally have some mild leakage, but this is minimally bothersome, nocturia 1-2 times at night.  PVRs have been normal, and is normal today at 15 mL.  He has not had any gross hematuria or UTIs.  He does report some new mild right-sided scrotal pain over the last month.  This seems to be improving on its own.  It is primarily bothersome to him when he is laying down at night.  On exam, the testicles are 20 cc and descended bilaterally without masses, no obvious hernia noted.  No tenderness in the scrotum.  Reassurance was provided, recommend snug fitting underwear.  Return precautions discussed regarding scrotal pain.  He also had questions about PSA screening today.  We reviewed the AUA guidelines that do not recommend routine screening in men over age 24, and he is in agreement to not resume PSA screening at age 34.  Finasteride refilled Behavioral strategies discussed regarding urination and scrotal pain RTC 1 year symptom check  Billey Co, MD  [facility] [address] [place], [place] [address] [phone]

## 2020-11-09 NOTE — Patient Instructions (Signed)
Pelvic Pain, Male  Pelvic pain is pain in your lower abdomen, below your belly button and between your hips. The pain may start suddenly (be acute), keep coming back (recur), or last a long time (become chronic). Pelvic pain that lasts longer than six months is considered chronic. There are many possible causes of pelvic pain. Sometimes, the cause is not known.  Pelvic pain may affect your:  · Prostate gland.  · Urinary system.  · Digestive tract.  · Musculoskeletal system. Strained muscles or ligaments may cause pelvic pain.  Follow these instructions at home:    Medicines  · Take over-the-counter and prescription medicines only as told by your health care provider.  · If you were prescribed an antibiotic medicine, take it as told by your health care provider. Do not stop taking the antibiotic even if you start to feel better.  Managing pain, stiffness, and swelling    · Take warm water baths (sitz baths). Sitz baths help with relaxing your pelvic floor muscles.  ? For a sitz bath, the water only comes up to your hips and covers your buttocks. A sitz bath may done at home in a bathtub or with a portable sitz bath that fits over the toilet.  · If directed, apply heat to the affected area before you exercise. Use the heat source that your health care provider recommends, such as a moist heat pack or a heating pad.  ? Place a towel between your skin and the heat source.  ? Leave the heat on for 20-30 minutes.  ? Remove the heat if your skin turns bright red. This is especially important if you are unable to feel pain, heat, or cold. You may have a greater risk of getting burned.  General instructions  · Rest as told by your health care provider.  · Keep a journal of your pelvic pain. Write down:  ? When the pain started.  ? Where the pain is located.  ? What seems to make the pain better or worse.  ? Any symptoms you have along with the pain.  · Follow your treatment plan as told by your health care provider. This may  include:  ? Pelvic physical therapy.  ? Yoga, meditation, and exercise.  ? Biofeedback. This process trains you to manage your body's response (physiological response) through breathing techniques and relaxation methods. You will work with a therapist while machines are used to monitor your physical symptoms.  ? Acupuncture. This is a type of treatment that involves stimulating specific points on your body by inserting thin needles through your skin to treat pain.  · Keep all follow-up visits as told by your health care provider. This is important.  Contact a health care provider if:  · Medicine does not help your pain.  · Your pain comes back.  · You have new symptoms.  · You have a fever or chills.  · You are constipated.  · You have blood in your urine or stool.  · You feel weak or light-headed.  Get help right away if:  · You have sudden severe pain.  · Your pain steadily gets worse.  · You have severe pain along with fever, nausea, vomiting, or excessive sweating.  Summary  · Pelvic pain is pain in your lower abdomen, below your belly button and between your hips. There are many possible causes of pelvic pain. Sometimes, the cause is not known.  · Take over-the-counter and prescription medicines only as told   Get help right away if you have severe pain along with fever, nausea, vomiting, or excessive sweating.  Keep all follow-up visits as told by your health care provider. This is important. This information is not intended to replace advice given to you by your health care provider. Make sure you discuss any questions you have with your health care provider. Document Revised: 04/30/2018 Document Reviewed: 04/30/2018 Elsevier Patient Education   [employer].

## 2021-01-11 DIAGNOSIS — E113393 Type 2 diabetes mellitus with moderate nonproliferative diabetic retinopathy without macular edema, bilateral: Secondary | ICD-10-CM | POA: Diagnosis not present

## 2021-01-11 DIAGNOSIS — H524 Presbyopia: Secondary | ICD-10-CM | POA: Diagnosis not present

## 2021-01-30 DIAGNOSIS — Z20822 Contact with and (suspected) exposure to covid-19: Secondary | ICD-10-CM | POA: Diagnosis not present

## 2021-01-30 DIAGNOSIS — J449 Chronic obstructive pulmonary disease, unspecified: Secondary | ICD-10-CM | POA: Diagnosis not present

## 2021-01-30 DIAGNOSIS — J40 Bronchitis, not specified as acute or chronic: Secondary | ICD-10-CM | POA: Diagnosis not present

## 2021-02-08 DIAGNOSIS — Z79899 Other long term (current) drug therapy: Secondary | ICD-10-CM | POA: Diagnosis not present

## 2021-02-08 DIAGNOSIS — M0579 Rheumatoid arthritis with rheumatoid factor of multiple sites without organ or systems involvement: Secondary | ICD-10-CM | POA: Diagnosis not present

## 2021-02-14 DIAGNOSIS — N1831 Chronic kidney disease, stage 3a: Secondary | ICD-10-CM | POA: Diagnosis not present

## 2021-02-14 DIAGNOSIS — N179 Acute kidney failure, unspecified: Secondary | ICD-10-CM | POA: Diagnosis not present

## 2021-02-14 DIAGNOSIS — M720 Palmar fascial fibromatosis [Dupuytren]: Secondary | ICD-10-CM | POA: Diagnosis not present

## 2021-02-14 DIAGNOSIS — M0579 Rheumatoid arthritis with rheumatoid factor of multiple sites without organ or systems involvement: Secondary | ICD-10-CM | POA: Diagnosis not present

## 2021-02-14 DIAGNOSIS — Z79899 Other long term (current) drug therapy: Secondary | ICD-10-CM | POA: Diagnosis not present

## 2021-02-23 DIAGNOSIS — E785 Hyperlipidemia, unspecified: Secondary | ICD-10-CM | POA: Diagnosis not present

## 2021-02-23 DIAGNOSIS — E1121 Type 2 diabetes mellitus with diabetic nephropathy: Secondary | ICD-10-CM | POA: Diagnosis not present

## 2021-02-23 DIAGNOSIS — N183 Chronic kidney disease, stage 3 unspecified: Secondary | ICD-10-CM | POA: Diagnosis not present

## 2021-03-01 DIAGNOSIS — I420 Dilated cardiomyopathy: Secondary | ICD-10-CM | POA: Diagnosis not present

## 2021-03-01 DIAGNOSIS — J449 Chronic obstructive pulmonary disease, unspecified: Secondary | ICD-10-CM | POA: Diagnosis not present

## 2021-03-01 DIAGNOSIS — I10 Essential (primary) hypertension: Secondary | ICD-10-CM | POA: Diagnosis not present

## 2021-03-01 DIAGNOSIS — E1121 Type 2 diabetes mellitus with diabetic nephropathy: Secondary | ICD-10-CM | POA: Diagnosis not present

## 2021-03-01 DIAGNOSIS — Z950 Presence of cardiac pacemaker: Secondary | ICD-10-CM | POA: Diagnosis not present

## 2021-03-01 DIAGNOSIS — I340 Nonrheumatic mitral (valve) insufficiency: Secondary | ICD-10-CM | POA: Diagnosis not present

## 2021-03-01 DIAGNOSIS — E785 Hyperlipidemia, unspecified: Secondary | ICD-10-CM | POA: Diagnosis not present

## 2021-03-01 DIAGNOSIS — G4730 Sleep apnea, unspecified: Secondary | ICD-10-CM | POA: Diagnosis not present

## 2021-03-06 DIAGNOSIS — D849 Immunodeficiency, unspecified: Secondary | ICD-10-CM | POA: Diagnosis not present

## 2021-03-06 DIAGNOSIS — E785 Hyperlipidemia, unspecified: Secondary | ICD-10-CM | POA: Diagnosis not present

## 2021-03-06 DIAGNOSIS — N183 Chronic kidney disease, stage 3 unspecified: Secondary | ICD-10-CM | POA: Diagnosis not present

## 2021-03-06 DIAGNOSIS — Z0000 Encounter for general adult medical examination without abnormal findings: Secondary | ICD-10-CM | POA: Diagnosis not present

## 2021-03-06 DIAGNOSIS — G720 Drug-induced myopathy: Secondary | ICD-10-CM | POA: Diagnosis not present

## 2021-03-06 DIAGNOSIS — E1122 Type 2 diabetes mellitus with diabetic chronic kidney disease: Secondary | ICD-10-CM | POA: Diagnosis not present

## 2021-03-06 DIAGNOSIS — M0579 Rheumatoid arthritis with rheumatoid factor of multiple sites without organ or systems involvement: Secondary | ICD-10-CM | POA: Diagnosis not present

## 2021-03-06 DIAGNOSIS — I10 Essential (primary) hypertension: Secondary | ICD-10-CM | POA: Diagnosis not present

## 2021-03-06 DIAGNOSIS — E1140 Type 2 diabetes mellitus with diabetic neuropathy, unspecified: Secondary | ICD-10-CM | POA: Diagnosis not present

## 2021-03-09 DIAGNOSIS — L821 Other seborrheic keratosis: Secondary | ICD-10-CM | POA: Diagnosis not present

## 2021-03-09 DIAGNOSIS — Z85820 Personal history of malignant melanoma of skin: Secondary | ICD-10-CM | POA: Diagnosis not present

## 2021-03-09 DIAGNOSIS — D2262 Melanocytic nevi of left upper limb, including shoulder: Secondary | ICD-10-CM | POA: Diagnosis not present

## 2021-03-09 DIAGNOSIS — D2261 Melanocytic nevi of right upper limb, including shoulder: Secondary | ICD-10-CM | POA: Diagnosis not present

## 2021-03-09 DIAGNOSIS — D225 Melanocytic nevi of trunk: Secondary | ICD-10-CM | POA: Diagnosis not present

## 2021-03-09 DIAGNOSIS — D039 Melanoma in situ, unspecified: Secondary | ICD-10-CM | POA: Diagnosis not present

## 2021-05-17 DIAGNOSIS — M0579 Rheumatoid arthritis with rheumatoid factor of multiple sites without organ or systems involvement: Secondary | ICD-10-CM | POA: Diagnosis not present

## 2021-05-17 DIAGNOSIS — Z79899 Other long term (current) drug therapy: Secondary | ICD-10-CM | POA: Diagnosis not present

## 2021-06-07 DIAGNOSIS — N183 Chronic kidney disease, stage 3 unspecified: Secondary | ICD-10-CM | POA: Diagnosis not present

## 2021-06-07 DIAGNOSIS — M0579 Rheumatoid arthritis with rheumatoid factor of multiple sites without organ or systems involvement: Secondary | ICD-10-CM | POA: Diagnosis not present

## 2021-06-07 DIAGNOSIS — E785 Hyperlipidemia, unspecified: Secondary | ICD-10-CM | POA: Diagnosis not present

## 2021-06-07 DIAGNOSIS — E1121 Type 2 diabetes mellitus with diabetic nephropathy: Secondary | ICD-10-CM | POA: Diagnosis not present

## 2021-06-08 DIAGNOSIS — H0100A Unspecified blepharitis right eye, upper and lower eyelids: Secondary | ICD-10-CM | POA: Diagnosis not present

## 2021-06-14 DIAGNOSIS — R918 Other nonspecific abnormal finding of lung field: Secondary | ICD-10-CM | POA: Diagnosis not present

## 2021-06-14 DIAGNOSIS — E1122 Type 2 diabetes mellitus with diabetic chronic kidney disease: Secondary | ICD-10-CM | POA: Diagnosis not present

## 2021-06-14 DIAGNOSIS — M0579 Rheumatoid arthritis with rheumatoid factor of multiple sites without organ or systems involvement: Secondary | ICD-10-CM | POA: Diagnosis not present

## 2021-06-14 DIAGNOSIS — R0600 Dyspnea, unspecified: Secondary | ICD-10-CM | POA: Diagnosis not present

## 2021-06-14 DIAGNOSIS — G4730 Sleep apnea, unspecified: Secondary | ICD-10-CM | POA: Diagnosis not present

## 2021-06-14 DIAGNOSIS — D849 Immunodeficiency, unspecified: Secondary | ICD-10-CM | POA: Diagnosis not present

## 2021-06-14 DIAGNOSIS — N183 Chronic kidney disease, stage 3 unspecified: Secondary | ICD-10-CM | POA: Diagnosis not present

## 2021-06-14 DIAGNOSIS — Z950 Presence of cardiac pacemaker: Secondary | ICD-10-CM | POA: Diagnosis not present

## 2021-06-14 DIAGNOSIS — I129 Hypertensive chronic kidney disease with stage 1 through stage 4 chronic kidney disease, or unspecified chronic kidney disease: Secondary | ICD-10-CM | POA: Diagnosis not present

## 2021-06-14 DIAGNOSIS — E785 Hyperlipidemia, unspecified: Secondary | ICD-10-CM | POA: Diagnosis not present

## 2021-06-15 ENCOUNTER — Other Ambulatory Visit: Payer: Self-pay | Admitting: Internal Medicine

## 2021-06-15 DIAGNOSIS — M0579 Rheumatoid arthritis with rheumatoid factor of multiple sites without organ or systems involvement: Secondary | ICD-10-CM

## 2021-06-15 DIAGNOSIS — R9389 Abnormal findings on diagnostic imaging of other specified body structures: Secondary | ICD-10-CM

## 2021-06-21 DIAGNOSIS — L270 Generalized skin eruption due to drugs and medicaments taken internally: Secondary | ICD-10-CM | POA: Diagnosis not present

## 2021-07-10 ENCOUNTER — Ambulatory Visit: Payer: Medicare Other

## 2021-08-16 DIAGNOSIS — M0579 Rheumatoid arthritis with rheumatoid factor of multiple sites without organ or systems involvement: Secondary | ICD-10-CM | POA: Diagnosis not present

## 2021-08-16 DIAGNOSIS — Z79899 Other long term (current) drug therapy: Secondary | ICD-10-CM | POA: Diagnosis not present

## 2021-08-22 DIAGNOSIS — M720 Palmar fascial fibromatosis [Dupuytren]: Secondary | ICD-10-CM | POA: Diagnosis not present

## 2021-08-22 DIAGNOSIS — N1831 Chronic kidney disease, stage 3a: Secondary | ICD-10-CM | POA: Diagnosis not present

## 2021-08-22 DIAGNOSIS — M0579 Rheumatoid arthritis with rheumatoid factor of multiple sites without organ or systems involvement: Secondary | ICD-10-CM | POA: Diagnosis not present

## 2021-08-22 DIAGNOSIS — Z79899 Other long term (current) drug therapy: Secondary | ICD-10-CM | POA: Diagnosis not present

## 2021-09-13 DIAGNOSIS — Z23 Encounter for immunization: Secondary | ICD-10-CM | POA: Diagnosis not present

## 2021-09-13 DIAGNOSIS — J449 Chronic obstructive pulmonary disease, unspecified: Secondary | ICD-10-CM | POA: Diagnosis not present

## 2021-09-13 DIAGNOSIS — I10 Essential (primary) hypertension: Secondary | ICD-10-CM | POA: Diagnosis not present

## 2021-09-13 DIAGNOSIS — G4730 Sleep apnea, unspecified: Secondary | ICD-10-CM | POA: Diagnosis not present

## 2021-09-13 DIAGNOSIS — I420 Dilated cardiomyopathy: Secondary | ICD-10-CM | POA: Diagnosis not present

## 2021-09-13 DIAGNOSIS — Z950 Presence of cardiac pacemaker: Secondary | ICD-10-CM | POA: Diagnosis not present

## 2021-09-13 DIAGNOSIS — E785 Hyperlipidemia, unspecified: Secondary | ICD-10-CM | POA: Diagnosis not present

## 2021-09-13 DIAGNOSIS — I340 Nonrheumatic mitral (valve) insufficiency: Secondary | ICD-10-CM | POA: Diagnosis not present

## 2021-09-13 DIAGNOSIS — N183 Chronic kidney disease, stage 3 unspecified: Secondary | ICD-10-CM | POA: Diagnosis not present

## 2021-09-20 DIAGNOSIS — E113393 Type 2 diabetes mellitus with moderate nonproliferative diabetic retinopathy without macular edema, bilateral: Secondary | ICD-10-CM | POA: Diagnosis not present

## 2021-10-03 DIAGNOSIS — R002 Palpitations: Secondary | ICD-10-CM | POA: Diagnosis not present

## 2021-11-06 DIAGNOSIS — J440 Chronic obstructive pulmonary disease with acute lower respiratory infection: Secondary | ICD-10-CM | POA: Diagnosis not present

## 2021-11-09 ENCOUNTER — Other Ambulatory Visit: Payer: Self-pay

## 2021-11-09 ENCOUNTER — Ambulatory Visit (INDEPENDENT_AMBULATORY_CARE_PROVIDER_SITE_OTHER): Payer: Medicare Other | Admitting: Urology

## 2021-11-09 ENCOUNTER — Encounter: Payer: Self-pay | Admitting: Urology

## 2021-11-09 VITALS — BP 108/66 | HR 101 | Ht 72.0 in | Wt 225.0 lb

## 2021-11-09 DIAGNOSIS — N401 Enlarged prostate with lower urinary tract symptoms: Secondary | ICD-10-CM

## 2021-11-09 LAB — BLADDER SCAN AMB NON-IMAGING

## 2021-11-09 MED ORDER — FINASTERIDE 5 MG PO TABS: 5.0000 mg | ORAL_TABLET | Freq: Every day | ORAL | 3 refills | Status: DC

## 2021-11-09 NOTE — Progress Notes (Signed)
   11/09/2021 10:25 AM   Randy Lopez 20-Dec-1931 [id number]  Reason for visit: Follow up BPH, history of scrotal pain  HPI: 87 year old male with long history of BPH, as well as PVP in 2005 and in 2010 with Dr. Bernardo Heater.  Urinary symptoms have been well managed on finasteride.  He did not tolerate alpha blockers in the past secondary to nasal congestion.  He also has a history of a negative prostate biopsy in 2010 at age 69, and we opted to discontinue screening previously per the recommendations of the AUA guidelines.  He also reported some mild right-sided scrotal pain at our visit last year, but this was resolving on its own and exam was benign.  He denies any problems with scrotal pain over the last year  His urinary symptoms have been stable and he really denies any significant complaints today.  Denies any infections or gross hematuria.  PVR is normal at 42 mL.  He reports some mild stress incontinence with physical activity, but this is not particularly bothersome, and he does not need to use a pad.  We reviewed options like Kegel exercises or Cunningham/Weisner clamp if this worsens.  Finasteride refilled RTC 1 year PVR and symptom check   Billey Co, MD  [facility] [address] [place], [place] [address] [phone]

## 2021-11-09 NOTE — Patient Instructions (Signed)
If your urinary leakage gets worse, consider ordering a Cunningham clamp or Wiesner clamp from [email or web address].  This can be worn when you are doing physical activity to prevent leakage, and could be used just as needed.  Saw palmetto is fine to take over-the-counter, there is very low risk, and about 30% of patients will notice some improvement in her urinary symptoms

## 2021-11-14 DIAGNOSIS — Z950 Presence of cardiac pacemaker: Secondary | ICD-10-CM | POA: Diagnosis not present

## 2021-11-15 DIAGNOSIS — J449 Chronic obstructive pulmonary disease, unspecified: Secondary | ICD-10-CM | POA: Diagnosis not present

## 2021-11-15 DIAGNOSIS — I340 Nonrheumatic mitral (valve) insufficiency: Secondary | ICD-10-CM | POA: Diagnosis not present

## 2021-11-15 DIAGNOSIS — I420 Dilated cardiomyopathy: Secondary | ICD-10-CM | POA: Diagnosis not present

## 2021-11-15 DIAGNOSIS — Z950 Presence of cardiac pacemaker: Secondary | ICD-10-CM | POA: Diagnosis not present

## 2021-11-15 DIAGNOSIS — I10 Essential (primary) hypertension: Secondary | ICD-10-CM | POA: Diagnosis not present

## 2021-11-15 DIAGNOSIS — N183 Chronic kidney disease, stage 3 unspecified: Secondary | ICD-10-CM | POA: Diagnosis not present

## 2021-11-15 DIAGNOSIS — G4730 Sleep apnea, unspecified: Secondary | ICD-10-CM | POA: Diagnosis not present

## 2021-11-15 DIAGNOSIS — Z23 Encounter for immunization: Secondary | ICD-10-CM | POA: Diagnosis not present

## 2021-11-15 DIAGNOSIS — E1121 Type 2 diabetes mellitus with diabetic nephropathy: Secondary | ICD-10-CM | POA: Diagnosis not present

## 2021-11-15 DIAGNOSIS — E785 Hyperlipidemia, unspecified: Secondary | ICD-10-CM | POA: Diagnosis not present

## 2021-11-23 DIAGNOSIS — M0579 Rheumatoid arthritis with rheumatoid factor of multiple sites without organ or systems involvement: Secondary | ICD-10-CM | POA: Diagnosis not present

## 2021-11-23 DIAGNOSIS — I420 Dilated cardiomyopathy: Secondary | ICD-10-CM | POA: Diagnosis not present

## 2021-11-23 DIAGNOSIS — J449 Chronic obstructive pulmonary disease, unspecified: Secondary | ICD-10-CM | POA: Diagnosis not present

## 2021-11-23 DIAGNOSIS — I129 Hypertensive chronic kidney disease with stage 1 through stage 4 chronic kidney disease, or unspecified chronic kidney disease: Secondary | ICD-10-CM | POA: Diagnosis not present

## 2021-11-23 DIAGNOSIS — N183 Chronic kidney disease, stage 3 unspecified: Secondary | ICD-10-CM | POA: Diagnosis not present

## 2021-11-23 DIAGNOSIS — E1121 Type 2 diabetes mellitus with diabetic nephropathy: Secondary | ICD-10-CM | POA: Diagnosis not present

## 2021-11-23 DIAGNOSIS — G720 Drug-induced myopathy: Secondary | ICD-10-CM | POA: Diagnosis not present

## 2021-11-23 DIAGNOSIS — D849 Immunodeficiency, unspecified: Secondary | ICD-10-CM | POA: Diagnosis not present

## 2021-11-23 DIAGNOSIS — E785 Hyperlipidemia, unspecified: Secondary | ICD-10-CM | POA: Diagnosis not present

## 2021-12-26 DIAGNOSIS — E039 Hypothyroidism, unspecified: Secondary | ICD-10-CM | POA: Diagnosis not present

## 2022-01-02 ENCOUNTER — Telehealth: Payer: Self-pay | Admitting: Family Medicine

## 2022-01-02 NOTE — Telephone Encounter (Signed)
Patient called and states he was seen in office on 11/09/21 and [employer]anks [employer] was billed and he does not have this insurance anymore. I informed him that we do not have any updated insurance card on file. He states he has [employer]. I informed him that he will need to bring in the new insurance card for [place] to add to his chart. We can then send the charges to his updated insurance company. Patient states he will bring the card in today.

## 2022-02-12 DIAGNOSIS — Z79899 Other long term (current) drug therapy: Secondary | ICD-10-CM | POA: Diagnosis not present

## 2022-02-12 DIAGNOSIS — M0579 Rheumatoid arthritis with rheumatoid factor of multiple sites without organ or systems involvement: Secondary | ICD-10-CM | POA: Diagnosis not present

## 2022-02-14 DIAGNOSIS — I10 Essential (primary) hypertension: Secondary | ICD-10-CM | POA: Diagnosis not present

## 2022-02-14 DIAGNOSIS — Z950 Presence of cardiac pacemaker: Secondary | ICD-10-CM | POA: Diagnosis not present

## 2022-02-14 DIAGNOSIS — E1121 Type 2 diabetes mellitus with diabetic nephropathy: Secondary | ICD-10-CM | POA: Diagnosis not present

## 2022-02-14 DIAGNOSIS — N183 Chronic kidney disease, stage 3 unspecified: Secondary | ICD-10-CM | POA: Diagnosis not present

## 2022-02-14 DIAGNOSIS — G4730 Sleep apnea, unspecified: Secondary | ICD-10-CM | POA: Diagnosis not present

## 2022-02-14 DIAGNOSIS — J449 Chronic obstructive pulmonary disease, unspecified: Secondary | ICD-10-CM | POA: Diagnosis not present

## 2022-02-14 DIAGNOSIS — I340 Nonrheumatic mitral (valve) insufficiency: Secondary | ICD-10-CM | POA: Diagnosis not present

## 2022-02-14 DIAGNOSIS — E785 Hyperlipidemia, unspecified: Secondary | ICD-10-CM | POA: Diagnosis not present

## 2022-02-14 DIAGNOSIS — I420 Dilated cardiomyopathy: Secondary | ICD-10-CM | POA: Diagnosis not present

## 2022-03-19 DIAGNOSIS — E1121 Type 2 diabetes mellitus with diabetic nephropathy: Secondary | ICD-10-CM | POA: Diagnosis not present

## 2022-03-19 DIAGNOSIS — E785 Hyperlipidemia, unspecified: Secondary | ICD-10-CM | POA: Diagnosis not present

## 2022-03-19 DIAGNOSIS — N183 Chronic kidney disease, stage 3 unspecified: Secondary | ICD-10-CM | POA: Diagnosis not present

## 2022-03-19 DIAGNOSIS — I10 Essential (primary) hypertension: Secondary | ICD-10-CM | POA: Diagnosis not present

## 2022-03-26 DIAGNOSIS — E1122 Type 2 diabetes mellitus with diabetic chronic kidney disease: Secondary | ICD-10-CM | POA: Diagnosis not present

## 2022-03-26 DIAGNOSIS — Z1389 Encounter for screening for other disorder: Secondary | ICD-10-CM | POA: Diagnosis not present

## 2022-03-26 DIAGNOSIS — D849 Immunodeficiency, unspecified: Secondary | ICD-10-CM | POA: Diagnosis not present

## 2022-03-26 DIAGNOSIS — E785 Hyperlipidemia, unspecified: Secondary | ICD-10-CM | POA: Diagnosis not present

## 2022-03-26 DIAGNOSIS — Z0000 Encounter for general adult medical examination without abnormal findings: Secondary | ICD-10-CM | POA: Diagnosis not present

## 2022-03-26 DIAGNOSIS — G4730 Sleep apnea, unspecified: Secondary | ICD-10-CM | POA: Diagnosis not present

## 2022-03-26 DIAGNOSIS — N183 Chronic kidney disease, stage 3 unspecified: Secondary | ICD-10-CM | POA: Diagnosis not present

## 2022-03-26 DIAGNOSIS — E119 Type 2 diabetes mellitus without complications: Secondary | ICD-10-CM | POA: Diagnosis not present

## 2022-03-26 DIAGNOSIS — I129 Hypertensive chronic kidney disease with stage 1 through stage 4 chronic kidney disease, or unspecified chronic kidney disease: Secondary | ICD-10-CM | POA: Diagnosis not present

## 2022-03-26 DIAGNOSIS — I420 Dilated cardiomyopathy: Secondary | ICD-10-CM | POA: Diagnosis not present

## 2022-03-26 DIAGNOSIS — E1121 Type 2 diabetes mellitus with diabetic nephropathy: Secondary | ICD-10-CM | POA: Diagnosis not present

## 2022-03-26 DIAGNOSIS — J449 Chronic obstructive pulmonary disease, unspecified: Secondary | ICD-10-CM | POA: Diagnosis not present

## 2022-04-04 DIAGNOSIS — E1121 Type 2 diabetes mellitus with diabetic nephropathy: Secondary | ICD-10-CM | POA: Diagnosis not present

## 2022-04-04 DIAGNOSIS — I340 Nonrheumatic mitral (valve) insufficiency: Secondary | ICD-10-CM | POA: Diagnosis not present

## 2022-04-04 DIAGNOSIS — I420 Dilated cardiomyopathy: Secondary | ICD-10-CM | POA: Diagnosis not present

## 2022-04-04 DIAGNOSIS — Z950 Presence of cardiac pacemaker: Secondary | ICD-10-CM | POA: Diagnosis not present

## 2022-04-04 DIAGNOSIS — E785 Hyperlipidemia, unspecified: Secondary | ICD-10-CM | POA: Diagnosis not present

## 2022-04-04 DIAGNOSIS — J449 Chronic obstructive pulmonary disease, unspecified: Secondary | ICD-10-CM | POA: Diagnosis not present

## 2022-04-04 DIAGNOSIS — G4730 Sleep apnea, unspecified: Secondary | ICD-10-CM | POA: Diagnosis not present

## 2022-04-04 DIAGNOSIS — I10 Essential (primary) hypertension: Secondary | ICD-10-CM | POA: Diagnosis not present

## 2022-04-04 DIAGNOSIS — N183 Chronic kidney disease, stage 3 unspecified: Secondary | ICD-10-CM | POA: Diagnosis not present

## 2022-04-23 DIAGNOSIS — R918 Other nonspecific abnormal finding of lung field: Secondary | ICD-10-CM | POA: Diagnosis not present

## 2022-04-23 DIAGNOSIS — M059 Rheumatoid arthritis with rheumatoid factor, unspecified: Secondary | ICD-10-CM | POA: Diagnosis not present

## 2022-04-23 DIAGNOSIS — J449 Chronic obstructive pulmonary disease, unspecified: Secondary | ICD-10-CM | POA: Diagnosis not present

## 2022-05-01 ENCOUNTER — Other Ambulatory Visit: Payer: Medicare Other

## 2022-05-01 ENCOUNTER — Ambulatory Visit
Admission: RE | Admit: 2022-05-01 | Discharge: 2022-05-01 | Disposition: A | Discharge status: Home / Self Care | Payer: Medicare HMO | Source: Ambulatory Visit | Attending: Internal Medicine | Admitting: Internal Medicine

## 2022-05-01 DIAGNOSIS — M0579 Rheumatoid arthritis with rheumatoid factor of multiple sites without organ or systems involvement: Secondary | ICD-10-CM | POA: Insufficient documentation

## 2022-05-01 DIAGNOSIS — I2510 Atherosclerotic heart disease of native coronary artery without angina pectoris: Secondary | ICD-10-CM | POA: Diagnosis not present

## 2022-05-01 DIAGNOSIS — R9389 Abnormal findings on diagnostic imaging of other specified body structures: Secondary | ICD-10-CM | POA: Diagnosis not present

## 2022-05-01 DIAGNOSIS — R0989 Other specified symptoms and signs involving the circulatory and respiratory systems: Secondary | ICD-10-CM | POA: Diagnosis not present

## 2022-05-01 DIAGNOSIS — J479 Bronchiectasis, uncomplicated: Secondary | ICD-10-CM | POA: Diagnosis not present

## 2022-05-01 DIAGNOSIS — J8410 Pulmonary fibrosis, unspecified: Secondary | ICD-10-CM | POA: Diagnosis not present

## 2022-05-14 DIAGNOSIS — J449 Chronic obstructive pulmonary disease, unspecified: Secondary | ICD-10-CM | POA: Diagnosis not present

## 2022-05-14 DIAGNOSIS — I340 Nonrheumatic mitral (valve) insufficiency: Secondary | ICD-10-CM | POA: Diagnosis not present

## 2022-05-18 ENCOUNTER — Other Ambulatory Visit: Payer: Self-pay | Admitting: Pulmonary Disease

## 2022-05-18 DIAGNOSIS — R1310 Dysphagia, unspecified: Secondary | ICD-10-CM

## 2022-06-05 DIAGNOSIS — J449 Chronic obstructive pulmonary disease, unspecified: Secondary | ICD-10-CM | POA: Diagnosis not present

## 2022-06-12 DIAGNOSIS — M0579 Rheumatoid arthritis with rheumatoid factor of multiple sites without organ or systems involvement: Secondary | ICD-10-CM | POA: Diagnosis not present

## 2022-06-12 DIAGNOSIS — Z79899 Other long term (current) drug therapy: Secondary | ICD-10-CM | POA: Diagnosis not present

## 2022-06-13 DIAGNOSIS — I10 Essential (primary) hypertension: Secondary | ICD-10-CM | POA: Diagnosis not present

## 2022-06-13 DIAGNOSIS — G4730 Sleep apnea, unspecified: Secondary | ICD-10-CM | POA: Diagnosis not present

## 2022-06-13 DIAGNOSIS — I340 Nonrheumatic mitral (valve) insufficiency: Secondary | ICD-10-CM | POA: Diagnosis not present

## 2022-06-13 DIAGNOSIS — N183 Chronic kidney disease, stage 3 unspecified: Secondary | ICD-10-CM | POA: Diagnosis not present

## 2022-06-13 DIAGNOSIS — E1121 Type 2 diabetes mellitus with diabetic nephropathy: Secondary | ICD-10-CM | POA: Diagnosis not present

## 2022-06-13 DIAGNOSIS — E785 Hyperlipidemia, unspecified: Secondary | ICD-10-CM | POA: Diagnosis not present

## 2022-06-13 DIAGNOSIS — J449 Chronic obstructive pulmonary disease, unspecified: Secondary | ICD-10-CM | POA: Diagnosis not present

## 2022-06-13 DIAGNOSIS — G720 Drug-induced myopathy: Secondary | ICD-10-CM | POA: Diagnosis not present

## 2022-06-13 DIAGNOSIS — I420 Dilated cardiomyopathy: Secondary | ICD-10-CM | POA: Diagnosis not present

## 2022-06-13 DIAGNOSIS — Z950 Presence of cardiac pacemaker: Secondary | ICD-10-CM | POA: Diagnosis not present

## 2022-06-14 ENCOUNTER — Ambulatory Visit
Admission: RE | Admit: 2022-06-14 | Discharge: 2022-06-14 | Disposition: A | Discharge status: Home / Self Care | Payer: Medicare HMO | Source: Ambulatory Visit | Attending: Pulmonary Disease | Admitting: Pulmonary Disease

## 2022-06-14 DIAGNOSIS — R1310 Dysphagia, unspecified: Secondary | ICD-10-CM | POA: Insufficient documentation

## 2022-06-14 DIAGNOSIS — R1312 Dysphagia, oropharyngeal phase: Secondary | ICD-10-CM | POA: Insufficient documentation

## 2022-06-14 NOTE — Therapy (Signed)
[facility] [place], [place], [address] Phone: [phone]   Fax:     Modified Barium Swallow  Patient Details  Name: Randy Lopez MRN: [id number] Date of Birth: [birth year] No data recorded  Encounter Date: 06/14/2022   End of Session - 06/14/22 1707     Visit Number 1    Number of Visits 1    Date for SLP Re-Evaluation 06/14/22    SLP Start Time 48    SLP Stop Time  2376    SLP Time Calculation (min) 75 min    Activity Tolerance Patient tolerated treatment well             Past Medical History:  Diagnosis Date   Acute respiratory failure ([place])    Anemia, unspecified    Arrhythmia    A-Fib   BPH (benign prostatic hyperplasia)    Chronic kidney disease    Coronary atherosclerosis of native coronary artery    Dupuytren's contracture    ED (erectile dysfunction)    Essential hypertension, benign    Other and unspecified hyperlipidemia    Rheumatoid arthritis(714.0)    Sleep apnea    Trigger finger (acquired)    Type II or unspecified type diabetes mellitus without mention of complication, not stated as uncontrolled     Past Surgical History:  Procedure Laterality Date   ESOPHAGOGASTRODUODENOSCOPY (EGD) WITH PROPOFOL N/A 05/30/2016   Procedure: ESOPHAGOGASTRODUODENOSCOPY (EGD) WITH PROPOFOL;  Surgeon: Manya Silvas, MD;  Location: [facility] ENDOSCOPY;  Service: Endoscopy;  Laterality: N/A;   INSERT / REPLACE / REMOVE PACEMAKER      There were no vitals filed for this visit.    Subjective: Patient behavior: (alertness, ability to follow instructions, etc.): alert/oriented and able to describe his c/o swallowing and medical history. Pleasant and talkative. Min HOH.  OF NOTE: pt's LEFT eye did not close fully during blinking.   Chief complaint: dysphagia. Pt's c/o swallowing was that "sometimes water catches in the back of my throat"; and "grits stop here in my chest", pointing to his  mid-Sternum area.  PMH includes: PMH of HTN, DM2, COPD, RA, CKD, CAD, dilated cardiomyopathy, BPH, bronchitis.   CT of CHEST 04/2022 revealed: "Bilateral lower lobe centrilobular nodularity, more severe within the right lower lobe, most in keeping with changes of acute infection or aspiration. Superimposed airway inflammation- diffuse bronchial wall thickening. No central obstructing lesion. Patulous esophagus again noted, stable since prior examination, suggesting changes of underlying esophageal dysmotility.".   DG Esophagus in 04/2016: "Mid cervical esophageal smooth stricture.  2. Prominent narrowing of the Distal Esophagus at the gastroesophageal junction with prominent dilatation of the thoracic esophagus. Poor esophageal motility noted. These findings suggests the possibility of achalasia. A distal esophageal benign or malignant stricture could also present in this fashion.".  EGD in 04/2016 resulted in Dilation of esophagus.    OM Exam: grossly WFL; posterior lingual strength appropriately; mildly decreased lingual tip elevation.  Cough - strong; +volitional swallow. Pt endorsed some mild ill-wear of the Bottom Partial Denture plate - stated some issues w/ wearing and that he could "just as easily take it out" for the test. Encouraged him to wear it for mastication of food trial. He does wear it most often to eat at home he said.       Objective:  Radiological Procedure: A videoflouroscopic evaluation of oral-preparatory, reflex initiation, and pharyngeal phases of the swallow was performed; as well as a screening of the  upper esophageal phase.  POSTURE: upright -- apparent Osteophyte noted at ~C5-6 adjacent to UES VIEW: COMPENSATORY STRATEGIES: BOLUSES ADMINISTERED:  Thin Liquid:  Nectar-thick Liquid:  Honey-thick Liquid:  Puree:  Mechanical Soft: RESULTS OF EVALUATION: ORAL PREPARATORY PHASE: (The lips, tongue, and velum are observed for strength and coordination)        **Overall Severity Rating:  SWALLOW INITIATION/REFLEX: (The reflex is normal if "triggered" by the time the bolus reached the base of the tongue)  **Overall Severity Rating:  PHARYNGEAL PHASE: (Pharyngeal function is normal if the bolus shows rapid, smooth, and continuous transit through the pharynx and there is no pharyngeal residue after the swallow)  **Overall Severity Rating:  LARYNGEAL PENETRATION: (Material entering into the laryngeal inlet/vestibule but not aspirated)  ASPIRATION: ESOPHAGEAL PHASE: (Screening of the upper esophagus)   ASSESSMENT:   PLAN/RECOMMENDATIONS:  A. Diet: Regular diet w/ well-Cut meats, foods; moistened foods. Thin liquids VIA CUP using Small, Single sips and a f/u, DRY swallow to clear.  Pills WHOLE in a Puree for safer swallowing.  B. Swallowing Precautions: aspiration precautions to include: SINGLE, SMALL sips; f/u, DRY SWALLOW to clear mouth/throat fully of any residue.   C. Recommended consultation to: continue to f/u w/ PCP re: aging and swallowing and impact of dysphagia. Can f/u w/ GI for direct viewing of Esophagus to r/o any Dysmotility secondary to noted bolus stasis w/ retrograde flow in the Cervical Esophagus.   D. Therapy recommendations: recommend pt f/u w/ Outpatient ST services for Dysphagia therapy if he wishes for continued education and swallowing strategies.   E. Results and recommendations were discussed w/ Patient; video/study viewed together and questions answered. Swallowing precautions/strategies were discussed and practiced together based on the results of the study. Pt had no further questions.       Patient will benefit from skilled therapeutic intervention in order to improve the following deficits and impairments:   Oropharyngeal dysphagia  Dysphagia, unspecified type - Plan: DG SWALLOW FUNC OP MEDICARE SPEECH PATH, DG SWALLOW FUNC OP MEDICARE SPEECH PATH        Problem List Patient Active Problem List   Diagnosis  Date Noted   Hyperlipidemia 12/31/2018   Prostatic hypertrophy 12/31/2018   Sleep apnea 12/31/2018   Dilated cardiomyopathy ([place]) 10/15/2016   BPH (benign prostatic hyperplasia) 09/18/2016   COPD with chronic bronchitis ([place]) 08/10/2016   Bilateral renal cysts 08/06/2016   Calculus of gallbladder without cholecystitis 08/06/2016   Arthritis, rheumatoid ([place]) 05/08/2016   CKD (chronic kidney disease) stage 3, GFR 30-59 ml/min (HCC) 11/04/2014   Moderate mitral regurgitation 10/07/2014   Pacemaker 10/07/2014   Elevated prostate specific antigen (PSA) 10/14/2012   Bradycardia 08/13/2011   Second degree atrioventricular block 08/13/2011   Diabetes mellitus 08/13/2011   HTN (hypertension) 08/13/2011   SOB (shortness of breath) 08/13/2011          Orinda Kenner, MS, CCC-SLP Speech Language Pathologist Re[facility]; [place] [phone] ([address]) [place], [place] 06/14/2022, 5:14 PM  [facility], [place], [address] Phone: [phone]   Fax:     Name: TORRELL KRUTZ MRN: [id number] Date of Birth: 02-11-1931

## 2022-06-20 DIAGNOSIS — N183 Chronic kidney disease, stage 3 unspecified: Secondary | ICD-10-CM | POA: Diagnosis not present

## 2022-06-20 DIAGNOSIS — E1121 Type 2 diabetes mellitus with diabetic nephropathy: Secondary | ICD-10-CM | POA: Diagnosis not present

## 2022-06-25 DIAGNOSIS — J479 Bronchiectasis, uncomplicated: Secondary | ICD-10-CM | POA: Diagnosis not present

## 2022-06-27 DIAGNOSIS — N183 Chronic kidney disease, stage 3 unspecified: Secondary | ICD-10-CM | POA: Diagnosis not present

## 2022-06-27 DIAGNOSIS — I129 Hypertensive chronic kidney disease with stage 1 through stage 4 chronic kidney disease, or unspecified chronic kidney disease: Secondary | ICD-10-CM | POA: Diagnosis not present

## 2022-06-27 DIAGNOSIS — J8410 Pulmonary fibrosis, unspecified: Secondary | ICD-10-CM | POA: Diagnosis not present

## 2022-06-27 DIAGNOSIS — J449 Chronic obstructive pulmonary disease, unspecified: Secondary | ICD-10-CM | POA: Diagnosis not present

## 2022-06-27 DIAGNOSIS — I420 Dilated cardiomyopathy: Secondary | ICD-10-CM | POA: Diagnosis not present

## 2022-06-27 DIAGNOSIS — E1122 Type 2 diabetes mellitus with diabetic chronic kidney disease: Secondary | ICD-10-CM | POA: Diagnosis not present

## 2022-06-27 DIAGNOSIS — D849 Immunodeficiency, unspecified: Secondary | ICD-10-CM | POA: Diagnosis not present

## 2022-06-27 DIAGNOSIS — M069 Rheumatoid arthritis, unspecified: Secondary | ICD-10-CM | POA: Diagnosis not present

## 2022-06-27 DIAGNOSIS — G4730 Sleep apnea, unspecified: Secondary | ICD-10-CM | POA: Diagnosis not present

## 2022-07-12 DIAGNOSIS — E1121 Type 2 diabetes mellitus with diabetic nephropathy: Secondary | ICD-10-CM | POA: Diagnosis not present

## 2022-08-13 DIAGNOSIS — H10233 Serous conjunctivitis, except viral, bilateral: Secondary | ICD-10-CM | POA: Diagnosis not present

## 2022-08-21 DIAGNOSIS — H2513 Age-related nuclear cataract, bilateral: Secondary | ICD-10-CM | POA: Diagnosis not present

## 2022-08-21 DIAGNOSIS — E119 Type 2 diabetes mellitus without complications: Secondary | ICD-10-CM | POA: Diagnosis not present

## 2022-08-21 DIAGNOSIS — H04123 Dry eye syndrome of bilateral lacrimal glands: Secondary | ICD-10-CM | POA: Diagnosis not present

## 2022-08-21 DIAGNOSIS — Z7984 Long term (current) use of oral hypoglycemic drugs: Secondary | ICD-10-CM | POA: Diagnosis not present

## 2022-08-21 DIAGNOSIS — H52223 Regular astigmatism, bilateral: Secondary | ICD-10-CM | POA: Diagnosis not present

## 2022-08-21 DIAGNOSIS — H524 Presbyopia: Secondary | ICD-10-CM | POA: Diagnosis not present

## 2022-08-21 DIAGNOSIS — H5203 Hypermetropia, bilateral: Secondary | ICD-10-CM | POA: Diagnosis not present

## 2022-09-24 DIAGNOSIS — M0579 Rheumatoid arthritis with rheumatoid factor of multiple sites without organ or systems involvement: Secondary | ICD-10-CM | POA: Diagnosis not present

## 2022-09-24 DIAGNOSIS — G4730 Sleep apnea, unspecified: Secondary | ICD-10-CM | POA: Diagnosis not present

## 2022-09-24 DIAGNOSIS — E1121 Type 2 diabetes mellitus with diabetic nephropathy: Secondary | ICD-10-CM | POA: Diagnosis not present

## 2022-09-24 DIAGNOSIS — E785 Hyperlipidemia, unspecified: Secondary | ICD-10-CM | POA: Diagnosis not present

## 2022-09-24 DIAGNOSIS — N183 Chronic kidney disease, stage 3 unspecified: Secondary | ICD-10-CM | POA: Diagnosis not present

## 2022-10-01 DIAGNOSIS — I129 Hypertensive chronic kidney disease with stage 1 through stage 4 chronic kidney disease, or unspecified chronic kidney disease: Secondary | ICD-10-CM | POA: Diagnosis not present

## 2022-10-01 DIAGNOSIS — Z23 Encounter for immunization: Secondary | ICD-10-CM | POA: Diagnosis not present

## 2022-10-01 DIAGNOSIS — D849 Immunodeficiency, unspecified: Secondary | ICD-10-CM | POA: Diagnosis not present

## 2022-10-01 DIAGNOSIS — J449 Chronic obstructive pulmonary disease, unspecified: Secondary | ICD-10-CM | POA: Diagnosis not present

## 2022-10-01 DIAGNOSIS — E1122 Type 2 diabetes mellitus with diabetic chronic kidney disease: Secondary | ICD-10-CM | POA: Diagnosis not present

## 2022-10-01 DIAGNOSIS — I420 Dilated cardiomyopathy: Secondary | ICD-10-CM | POA: Diagnosis not present

## 2022-10-01 DIAGNOSIS — J8410 Pulmonary fibrosis, unspecified: Secondary | ICD-10-CM | POA: Diagnosis not present

## 2022-10-01 DIAGNOSIS — E785 Hyperlipidemia, unspecified: Secondary | ICD-10-CM | POA: Diagnosis not present

## 2022-10-01 DIAGNOSIS — M069 Rheumatoid arthritis, unspecified: Secondary | ICD-10-CM | POA: Diagnosis not present

## 2022-10-10 DIAGNOSIS — L570 Actinic keratosis: Secondary | ICD-10-CM | POA: Diagnosis not present

## 2022-10-16 DIAGNOSIS — M720 Palmar fascial fibromatosis [Dupuytren]: Secondary | ICD-10-CM | POA: Diagnosis not present

## 2022-10-16 DIAGNOSIS — Z79899 Other long term (current) drug therapy: Secondary | ICD-10-CM | POA: Diagnosis not present

## 2022-10-16 DIAGNOSIS — M0579 Rheumatoid arthritis with rheumatoid factor of multiple sites without organ or systems involvement: Secondary | ICD-10-CM | POA: Diagnosis not present

## 2022-11-14 ENCOUNTER — Ambulatory Visit: Payer: PRIVATE HEALTH INSURANCE | Admitting: Urology

## 2022-11-28 ENCOUNTER — Encounter: Payer: Self-pay | Admitting: Urology

## 2022-11-28 ENCOUNTER — Ambulatory Visit: Payer: Medicare HMO | Admitting: Urology

## 2022-11-28 VITALS — BP 138/73 | HR 76 | Ht 72.0 in | Wt 225.0 lb

## 2022-11-28 DIAGNOSIS — N401 Enlarged prostate with lower urinary tract symptoms: Secondary | ICD-10-CM | POA: Diagnosis not present

## 2022-11-28 LAB — BLADDER SCAN AMB NON-IMAGING

## 2022-11-28 MED ORDER — FINASTERIDE 5 MG PO TABS: 5.0000 mg | ORAL_TABLET | Freq: Every day | ORAL | 3 refills | Status: DC

## 2022-11-28 NOTE — Addendum Note (Signed)
Addended by: Despina Hidden on: 11/28/2022 09:30 AM   Modules accepted: Orders

## 2022-11-28 NOTE — Patient Instructions (Signed)

## 2022-11-28 NOTE — Progress Notes (Signed)
   11/28/2022 9:26 AM   Randy Lopez 05-24-1931 [id number]  Reason for visit: Follow up BPH, nocturia, history of scrotal pain  HPI: 88 year old male with long history of BPH, as well as PVP in 2005 and in 2010 with Dr. Bernardo Heater.  Urinary symptoms have been well managed on finasteride.  He did not tolerate alpha blockers in the past secondary to nasal congestion.  He also has a history of a negative prostate biopsy in 2010 at age 8, and we opted to discontinue screening previously per the recommendations of the AUA guidelines.    He also has a history of scrotal pain a few years ago, but that has since resolved, no issues over the last 2 years.  His urinary symptoms have been stable on finasteride and he really denies any significant complaints today.  Denies any infections or gross hematuria.  PVR is normal at 0 mL.  He reports some mild stress incontinence with physical activity, but this is not particularly bothersome, and he does not need to use a pad.  He has nocturia 2-3 times overnight that is minimally bothersome.    Recent urinalysis with PCP in October 2023 benign.  Finasteride refilled RTC 1 year PVR and symptom check   Randy Co, MD  [facility] [address], [place], [place] [address] [phone]

## 2022-12-19 DIAGNOSIS — I420 Dilated cardiomyopathy: Secondary | ICD-10-CM | POA: Diagnosis not present

## 2022-12-19 DIAGNOSIS — J8410 Pulmonary fibrosis, unspecified: Secondary | ICD-10-CM | POA: Diagnosis not present

## 2022-12-19 DIAGNOSIS — I10 Essential (primary) hypertension: Secondary | ICD-10-CM | POA: Diagnosis not present

## 2022-12-19 DIAGNOSIS — G4730 Sleep apnea, unspecified: Secondary | ICD-10-CM | POA: Diagnosis not present

## 2022-12-19 DIAGNOSIS — Z950 Presence of cardiac pacemaker: Secondary | ICD-10-CM | POA: Diagnosis not present

## 2022-12-19 DIAGNOSIS — J479 Bronchiectasis, uncomplicated: Secondary | ICD-10-CM | POA: Diagnosis not present

## 2022-12-19 DIAGNOSIS — J4489 Other specified chronic obstructive pulmonary disease: Secondary | ICD-10-CM | POA: Diagnosis not present

## 2022-12-19 DIAGNOSIS — I340 Nonrheumatic mitral (valve) insufficiency: Secondary | ICD-10-CM | POA: Diagnosis not present

## 2022-12-19 DIAGNOSIS — E785 Hyperlipidemia, unspecified: Secondary | ICD-10-CM | POA: Diagnosis not present

## 2023-02-05 DIAGNOSIS — M0579 Rheumatoid arthritis with rheumatoid factor of multiple sites without organ or systems involvement: Secondary | ICD-10-CM | POA: Diagnosis not present

## 2023-02-05 DIAGNOSIS — Z79899 Other long term (current) drug therapy: Secondary | ICD-10-CM | POA: Diagnosis not present

## 2023-02-05 DIAGNOSIS — M720 Palmar fascial fibromatosis [Dupuytren]: Secondary | ICD-10-CM | POA: Diagnosis not present

## 2023-02-25 DIAGNOSIS — E1121 Type 2 diabetes mellitus with diabetic nephropathy: Secondary | ICD-10-CM | POA: Diagnosis not present

## 2023-02-25 DIAGNOSIS — Z03818 Encounter for observation for suspected exposure to other biological agents ruled out: Secondary | ICD-10-CM | POA: Diagnosis not present

## 2023-02-25 DIAGNOSIS — J40 Bronchitis, not specified as acute or chronic: Secondary | ICD-10-CM | POA: Diagnosis not present

## 2023-03-11 DIAGNOSIS — R059 Cough, unspecified: Secondary | ICD-10-CM | POA: Diagnosis not present

## 2023-03-11 DIAGNOSIS — I420 Dilated cardiomyopathy: Secondary | ICD-10-CM | POA: Diagnosis not present

## 2023-03-11 DIAGNOSIS — E1121 Type 2 diabetes mellitus with diabetic nephropathy: Secondary | ICD-10-CM | POA: Diagnosis not present

## 2023-03-11 DIAGNOSIS — J40 Bronchitis, not specified as acute or chronic: Secondary | ICD-10-CM | POA: Diagnosis not present

## 2023-03-15 DIAGNOSIS — R600 Localized edema: Secondary | ICD-10-CM | POA: Diagnosis not present

## 2023-03-25 DIAGNOSIS — E785 Hyperlipidemia, unspecified: Secondary | ICD-10-CM | POA: Diagnosis not present

## 2023-03-25 DIAGNOSIS — E1121 Type 2 diabetes mellitus with diabetic nephropathy: Secondary | ICD-10-CM | POA: Diagnosis not present

## 2023-03-25 DIAGNOSIS — N1831 Chronic kidney disease, stage 3a: Secondary | ICD-10-CM | POA: Diagnosis not present

## 2023-04-01 DIAGNOSIS — I129 Hypertensive chronic kidney disease with stage 1 through stage 4 chronic kidney disease, or unspecified chronic kidney disease: Secondary | ICD-10-CM | POA: Diagnosis not present

## 2023-04-01 DIAGNOSIS — M069 Rheumatoid arthritis, unspecified: Secondary | ICD-10-CM | POA: Diagnosis not present

## 2023-04-01 DIAGNOSIS — E785 Hyperlipidemia, unspecified: Secondary | ICD-10-CM | POA: Diagnosis not present

## 2023-04-01 DIAGNOSIS — I420 Dilated cardiomyopathy: Secondary | ICD-10-CM | POA: Diagnosis not present

## 2023-04-01 DIAGNOSIS — E1122 Type 2 diabetes mellitus with diabetic chronic kidney disease: Secondary | ICD-10-CM | POA: Diagnosis not present

## 2023-04-01 DIAGNOSIS — J8410 Pulmonary fibrosis, unspecified: Secondary | ICD-10-CM | POA: Diagnosis not present

## 2023-04-01 DIAGNOSIS — N183 Chronic kidney disease, stage 3 unspecified: Secondary | ICD-10-CM | POA: Diagnosis not present

## 2023-04-01 DIAGNOSIS — J449 Chronic obstructive pulmonary disease, unspecified: Secondary | ICD-10-CM | POA: Diagnosis not present

## 2023-04-01 DIAGNOSIS — D849 Immunodeficiency, unspecified: Secondary | ICD-10-CM | POA: Diagnosis not present

## 2023-04-11 DIAGNOSIS — R600 Localized edema: Secondary | ICD-10-CM | POA: Diagnosis not present

## 2023-04-15 DIAGNOSIS — Z950 Presence of cardiac pacemaker: Secondary | ICD-10-CM | POA: Diagnosis not present

## 2023-04-15 DIAGNOSIS — E785 Hyperlipidemia, unspecified: Secondary | ICD-10-CM | POA: Diagnosis not present

## 2023-04-15 DIAGNOSIS — J479 Bronchiectasis, uncomplicated: Secondary | ICD-10-CM | POA: Diagnosis not present

## 2023-04-15 DIAGNOSIS — I340 Nonrheumatic mitral (valve) insufficiency: Secondary | ICD-10-CM | POA: Diagnosis not present

## 2023-04-15 DIAGNOSIS — G4730 Sleep apnea, unspecified: Secondary | ICD-10-CM | POA: Diagnosis not present

## 2023-04-15 DIAGNOSIS — I420 Dilated cardiomyopathy: Secondary | ICD-10-CM | POA: Diagnosis not present

## 2023-04-15 DIAGNOSIS — J4489 Other specified chronic obstructive pulmonary disease: Secondary | ICD-10-CM | POA: Diagnosis not present

## 2023-04-15 DIAGNOSIS — I10 Essential (primary) hypertension: Secondary | ICD-10-CM | POA: Diagnosis not present

## 2023-04-15 DIAGNOSIS — J8410 Pulmonary fibrosis, unspecified: Secondary | ICD-10-CM | POA: Diagnosis not present

## 2023-04-17 DIAGNOSIS — J849 Interstitial pulmonary disease, unspecified: Secondary | ICD-10-CM | POA: Diagnosis not present

## 2023-04-30 DIAGNOSIS — J849 Interstitial pulmonary disease, unspecified: Secondary | ICD-10-CM | POA: Diagnosis not present

## 2023-05-28 DIAGNOSIS — Z950 Presence of cardiac pacemaker: Secondary | ICD-10-CM | POA: Diagnosis not present

## 2023-05-28 DIAGNOSIS — I420 Dilated cardiomyopathy: Secondary | ICD-10-CM | POA: Diagnosis not present

## 2023-05-28 DIAGNOSIS — J479 Bronchiectasis, uncomplicated: Secondary | ICD-10-CM | POA: Diagnosis not present

## 2023-05-28 DIAGNOSIS — J4489 Other specified chronic obstructive pulmonary disease: Secondary | ICD-10-CM | POA: Diagnosis not present

## 2023-05-28 DIAGNOSIS — J8410 Pulmonary fibrosis, unspecified: Secondary | ICD-10-CM | POA: Diagnosis not present

## 2023-05-28 DIAGNOSIS — E1121 Type 2 diabetes mellitus with diabetic nephropathy: Secondary | ICD-10-CM | POA: Diagnosis not present

## 2023-05-28 DIAGNOSIS — I10 Essential (primary) hypertension: Secondary | ICD-10-CM | POA: Diagnosis not present

## 2023-05-28 DIAGNOSIS — I340 Nonrheumatic mitral (valve) insufficiency: Secondary | ICD-10-CM | POA: Diagnosis not present

## 2023-05-28 DIAGNOSIS — E785 Hyperlipidemia, unspecified: Secondary | ICD-10-CM | POA: Diagnosis not present

## 2023-06-06 DIAGNOSIS — Z79899 Other long term (current) drug therapy: Secondary | ICD-10-CM | POA: Diagnosis not present

## 2023-06-06 DIAGNOSIS — M0579 Rheumatoid arthritis with rheumatoid factor of multiple sites without organ or systems involvement: Secondary | ICD-10-CM | POA: Diagnosis not present

## 2023-07-18 IMAGING — CT CT CHEST W/O CM
2 of 4 series · 15 of 36 positions shown, 18 images · non-contrast
Comparison: 06/21/2014

CLINICAL DATA: Rheumatoid arthritis, abnormal chest x-ray, cough
and chest congestion



[Series 2: chest 2.00 · axial · 0.80mm/px · z∈[-1273,-937]mm · 12 of 200 slices shown, 15 images]
[im 16/200  mediastinal]
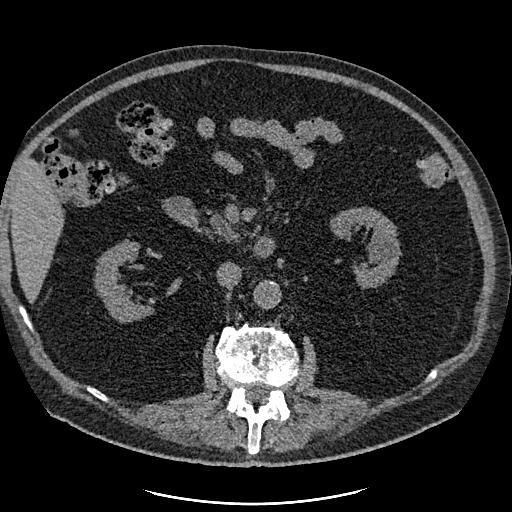
[im 16/200  lung]
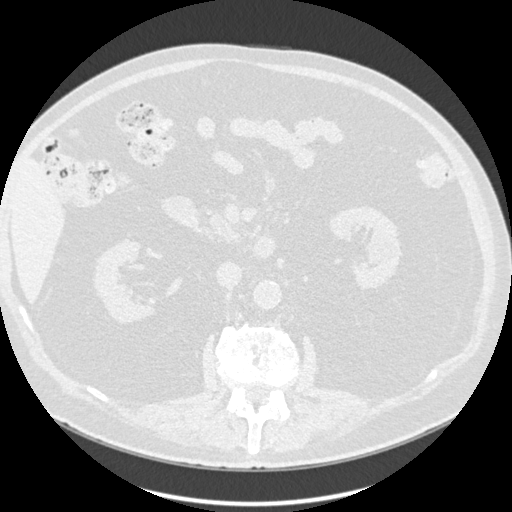
[im 31/200  lung]
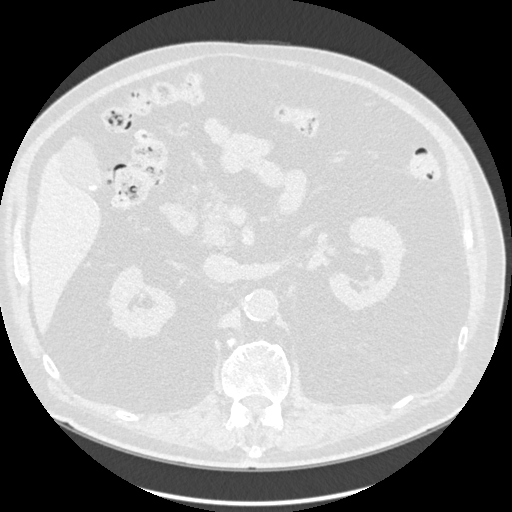
[im 46/200  lung]
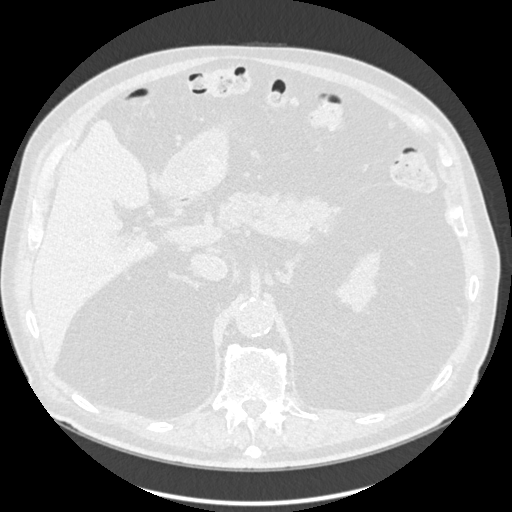
[im 62/200  lung]
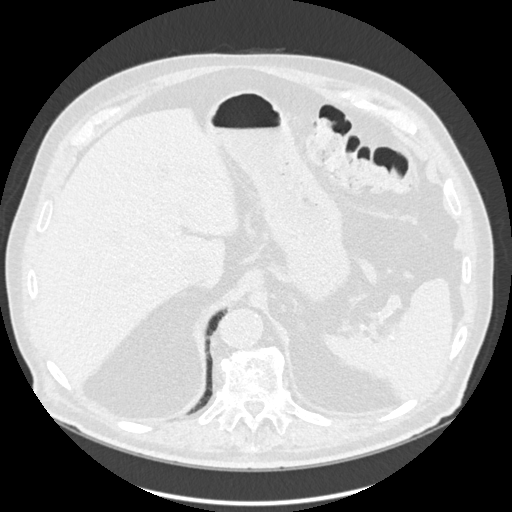
[im 77/200  mediastinal]
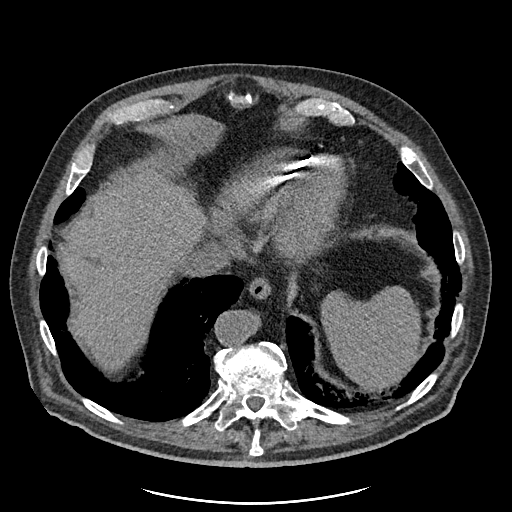
[im 77/200  lung]
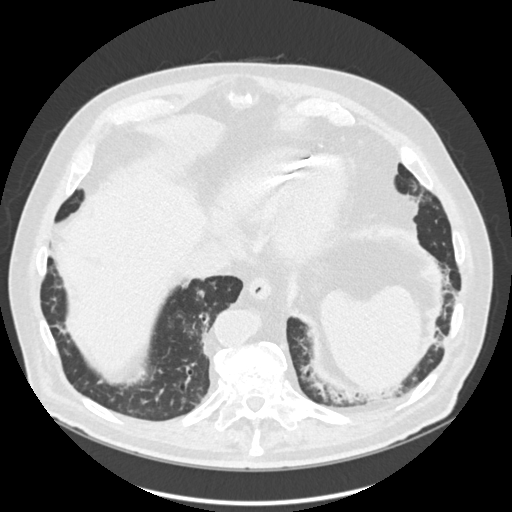
[im 92/200  lung]
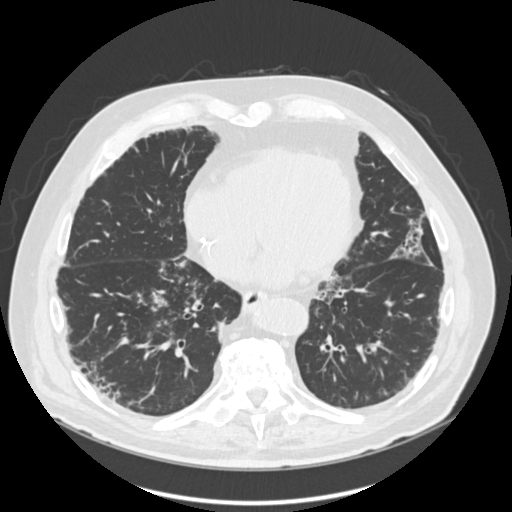
[im 108/200  lung]
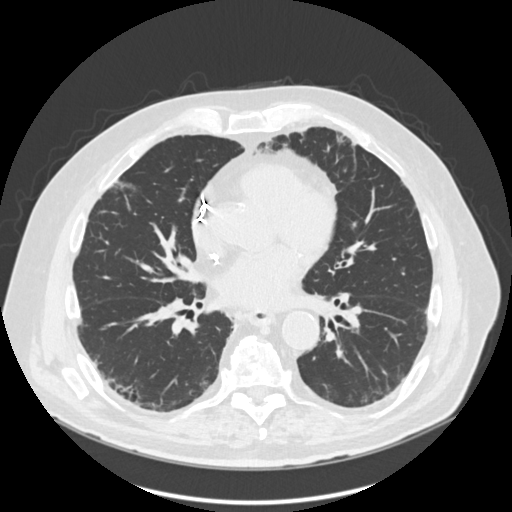
[im 123/200  lung]
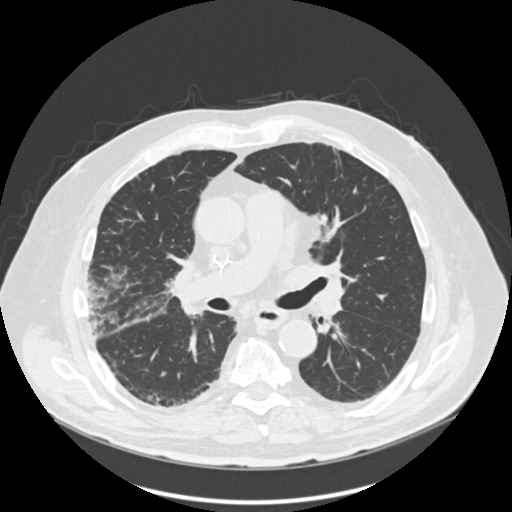
[im 138/200  mediastinal]
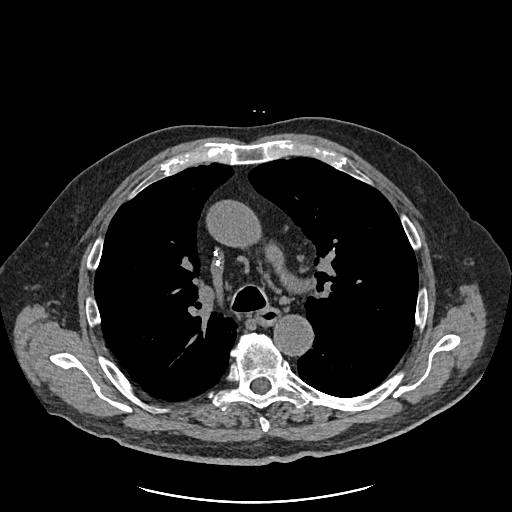
[im 138/200  lung]
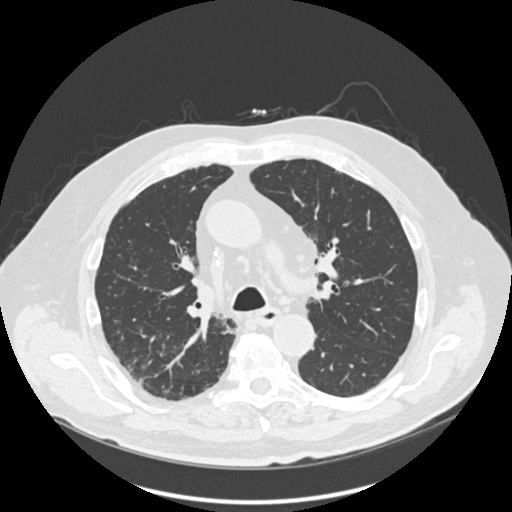
[im 154/200  lung]
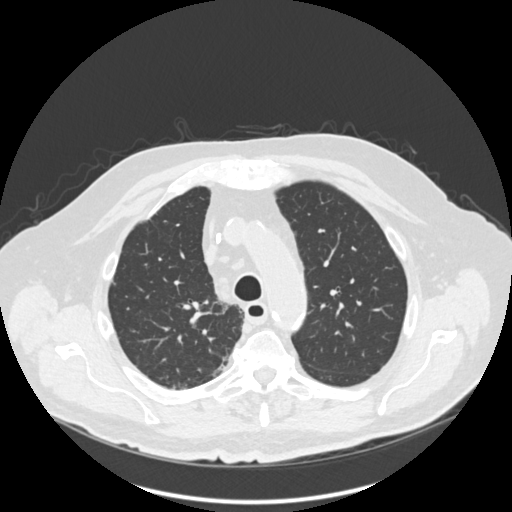
[im 169/200  lung]
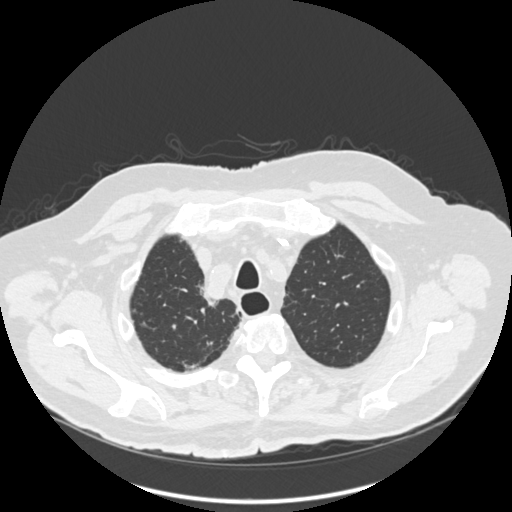
[im 184/200  lung]
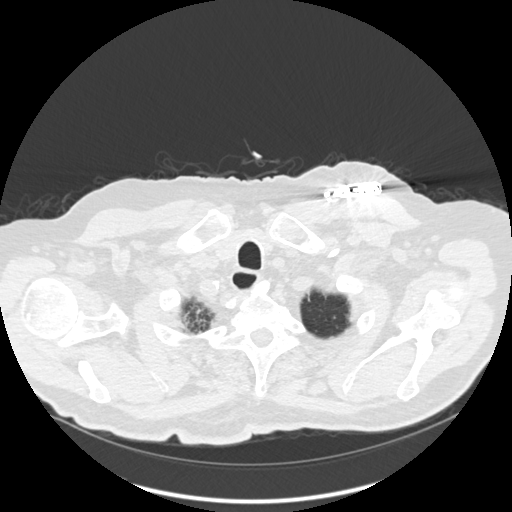

[Series 5: coronals chest 2.00 cor · coronal · 0.78mm/px · 3 of 185 slices shown]
[im 37/185  lung]
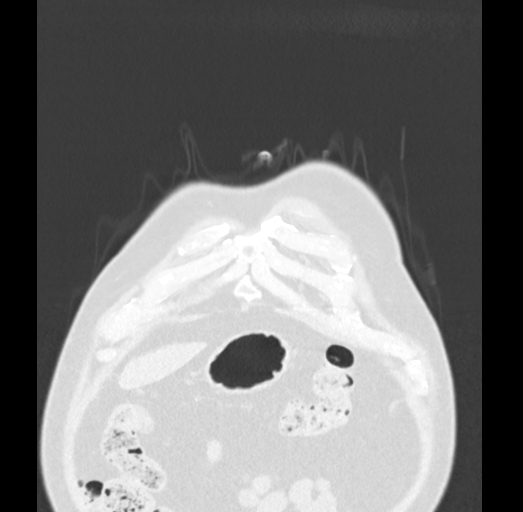
[im 74/185  lung]
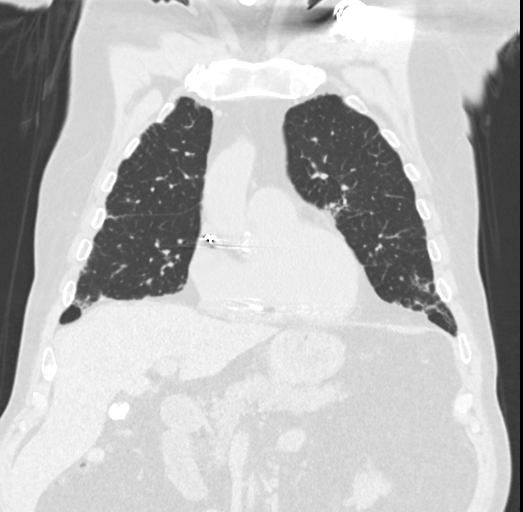
[im 111/185  lung]
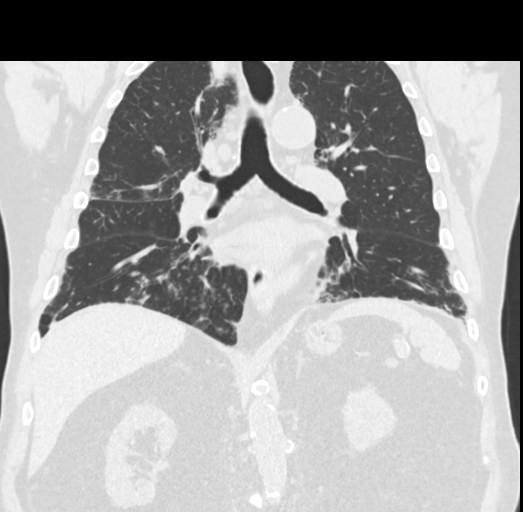

[15 of 36 positions shown; findings below may reference images not displayed]

FINDINGS: Cardiovascular: Extensive multi-vessel coronary artery
calcification. Global cardiac size within normal limits. Left
subclavian dual lead pacemaker is in place with leads within the
right atrium and right ventricle toward the apex. No pericardial
effusion. Central pulmonary arteries are of normal caliber. Moderate
atherosclerotic calcification within the thoracic aorta. No aortic
aneurysm.

Mediastinum/Nodes: Visualized thyroid is unremarkable. No pathologic
thoracic adenopathy. The esophagus is mildly patulous, similar to
prior examination, possibly reflecting changes of underlying
esophageal dysmotility.

Lungs/Pleura: There is subpleural reticulation, architectural
distortion, and traction bronchiolectasis demonstrating a slight
basilar gradient most in keeping with mild subpleural pulmonary
fibrotic change, progressive since prior examination. There has
developed superimposed centrilobular nodularity within the right
lower lobe and, to a lesser extent, left lower lobe most in keeping
with acute infection or aspiration. There is associated diffuse
bronchial wall thickening in keeping with airway inflammation. No
central obstructing lesion. No pneumothorax or pleural effusion.
Parenchymal scarring in volume loss within the inferior lingula is
unchanged.

Upper Abdomen: Cholelithiasis without pericholecystic inflammatory
change. Scattered colonic diverticula. No acute abnormality within
the visualized upper abdomen.

Musculoskeletal: Osseous structures are age-appropriate. No acute
bone abnormality. No lytic or blastic bone lesion.
IMPRESSION: Bilateral lower lobe centrilobular nodularity, more severe within
the right lower lobe, most in keeping with changes of acute
infection or aspiration. Superimposed airway inflammation. No
central obstructing lesion.

Patulous esophagus again noted, stable since prior examination,
suggesting changes of underlying esophageal dysmotility.

Mild progressive subpleural pulmonary fibrosis.

Extensive coronary artery calcification

Cholelithiasis

Aortic Atherosclerosis ([id number]).

## 2023-07-24 DIAGNOSIS — E1121 Type 2 diabetes mellitus with diabetic nephropathy: Secondary | ICD-10-CM | POA: Diagnosis not present

## 2023-07-24 DIAGNOSIS — E785 Hyperlipidemia, unspecified: Secondary | ICD-10-CM | POA: Diagnosis not present

## 2023-07-24 DIAGNOSIS — N183 Chronic kidney disease, stage 3 unspecified: Secondary | ICD-10-CM | POA: Diagnosis not present

## 2023-07-24 DIAGNOSIS — M0579 Rheumatoid arthritis with rheumatoid factor of multiple sites without organ or systems involvement: Secondary | ICD-10-CM | POA: Diagnosis not present

## 2023-07-31 DIAGNOSIS — I420 Dilated cardiomyopathy: Secondary | ICD-10-CM | POA: Diagnosis not present

## 2023-07-31 DIAGNOSIS — D849 Immunodeficiency, unspecified: Secondary | ICD-10-CM | POA: Diagnosis not present

## 2023-07-31 DIAGNOSIS — Z1331 Encounter for screening for depression: Secondary | ICD-10-CM | POA: Diagnosis not present

## 2023-07-31 DIAGNOSIS — M069 Rheumatoid arthritis, unspecified: Secondary | ICD-10-CM | POA: Diagnosis not present

## 2023-07-31 DIAGNOSIS — N183 Chronic kidney disease, stage 3 unspecified: Secondary | ICD-10-CM | POA: Diagnosis not present

## 2023-07-31 DIAGNOSIS — J8410 Pulmonary fibrosis, unspecified: Secondary | ICD-10-CM | POA: Diagnosis not present

## 2023-07-31 DIAGNOSIS — J449 Chronic obstructive pulmonary disease, unspecified: Secondary | ICD-10-CM | POA: Diagnosis not present

## 2023-07-31 DIAGNOSIS — I129 Hypertensive chronic kidney disease with stage 1 through stage 4 chronic kidney disease, or unspecified chronic kidney disease: Secondary | ICD-10-CM | POA: Diagnosis not present

## 2023-07-31 DIAGNOSIS — E1122 Type 2 diabetes mellitus with diabetic chronic kidney disease: Secondary | ICD-10-CM | POA: Diagnosis not present

## 2023-07-31 DIAGNOSIS — Z0000 Encounter for general adult medical examination without abnormal findings: Secondary | ICD-10-CM | POA: Diagnosis not present

## 2023-08-01 ENCOUNTER — Other Ambulatory Visit: Payer: Self-pay | Admitting: Internal Medicine

## 2023-08-01 DIAGNOSIS — R600 Localized edema: Secondary | ICD-10-CM

## 2023-08-06 ENCOUNTER — Ambulatory Visit
Admission: RE | Admit: 2023-08-06 | Discharge: 2023-08-06 | Disposition: A | Discharge status: Home / Self Care | Payer: Medicare HMO | Source: Ambulatory Visit | Attending: Internal Medicine | Admitting: Internal Medicine

## 2023-08-06 DIAGNOSIS — R600 Localized edema: Secondary | ICD-10-CM | POA: Insufficient documentation

## 2023-08-07 DIAGNOSIS — J849 Interstitial pulmonary disease, unspecified: Secondary | ICD-10-CM | POA: Diagnosis not present

## 2023-09-11 DIAGNOSIS — G4730 Sleep apnea, unspecified: Secondary | ICD-10-CM | POA: Diagnosis not present

## 2023-09-11 DIAGNOSIS — I420 Dilated cardiomyopathy: Secondary | ICD-10-CM | POA: Diagnosis not present

## 2023-09-11 DIAGNOSIS — J479 Bronchiectasis, uncomplicated: Secondary | ICD-10-CM | POA: Diagnosis not present

## 2023-09-11 DIAGNOSIS — E1121 Type 2 diabetes mellitus with diabetic nephropathy: Secondary | ICD-10-CM | POA: Diagnosis not present

## 2023-09-11 DIAGNOSIS — I340 Nonrheumatic mitral (valve) insufficiency: Secondary | ICD-10-CM | POA: Diagnosis not present

## 2023-09-11 DIAGNOSIS — J8410 Pulmonary fibrosis, unspecified: Secondary | ICD-10-CM | POA: Diagnosis not present

## 2023-09-11 DIAGNOSIS — J4489 Other specified chronic obstructive pulmonary disease: Secondary | ICD-10-CM | POA: Diagnosis not present

## 2023-09-11 DIAGNOSIS — Z950 Presence of cardiac pacemaker: Secondary | ICD-10-CM | POA: Diagnosis not present

## 2023-09-11 DIAGNOSIS — I10 Essential (primary) hypertension: Secondary | ICD-10-CM | POA: Diagnosis not present

## 2023-10-21 DIAGNOSIS — I340 Nonrheumatic mitral (valve) insufficiency: Secondary | ICD-10-CM | POA: Diagnosis not present

## 2023-10-21 DIAGNOSIS — Z950 Presence of cardiac pacemaker: Secondary | ICD-10-CM | POA: Diagnosis not present

## 2023-10-21 DIAGNOSIS — G4730 Sleep apnea, unspecified: Secondary | ICD-10-CM | POA: Diagnosis not present

## 2023-10-21 DIAGNOSIS — J8410 Pulmonary fibrosis, unspecified: Secondary | ICD-10-CM | POA: Diagnosis not present

## 2023-10-21 DIAGNOSIS — J4489 Other specified chronic obstructive pulmonary disease: Secondary | ICD-10-CM | POA: Diagnosis not present

## 2023-10-21 DIAGNOSIS — I420 Dilated cardiomyopathy: Secondary | ICD-10-CM | POA: Diagnosis not present

## 2023-10-21 DIAGNOSIS — Z23 Encounter for immunization: Secondary | ICD-10-CM | POA: Diagnosis not present

## 2023-10-21 DIAGNOSIS — R002 Palpitations: Secondary | ICD-10-CM | POA: Diagnosis not present

## 2023-10-21 DIAGNOSIS — E785 Hyperlipidemia, unspecified: Secondary | ICD-10-CM | POA: Diagnosis not present

## 2023-10-21 DIAGNOSIS — J479 Bronchiectasis, uncomplicated: Secondary | ICD-10-CM | POA: Diagnosis not present

## 2023-10-29 DIAGNOSIS — I420 Dilated cardiomyopathy: Secondary | ICD-10-CM | POA: Diagnosis not present

## 2023-10-29 DIAGNOSIS — J4489 Other specified chronic obstructive pulmonary disease: Secondary | ICD-10-CM | POA: Diagnosis not present

## 2023-10-29 DIAGNOSIS — I10 Essential (primary) hypertension: Secondary | ICD-10-CM | POA: Diagnosis not present

## 2023-10-29 DIAGNOSIS — N183 Chronic kidney disease, stage 3 unspecified: Secondary | ICD-10-CM | POA: Diagnosis not present

## 2023-10-29 DIAGNOSIS — Z950 Presence of cardiac pacemaker: Secondary | ICD-10-CM | POA: Diagnosis not present

## 2023-10-29 DIAGNOSIS — Z79899 Other long term (current) drug therapy: Secondary | ICD-10-CM | POA: Diagnosis not present

## 2023-10-29 DIAGNOSIS — J8410 Pulmonary fibrosis, unspecified: Secondary | ICD-10-CM | POA: Diagnosis not present

## 2023-10-29 DIAGNOSIS — E785 Hyperlipidemia, unspecified: Secondary | ICD-10-CM | POA: Diagnosis not present

## 2023-10-29 DIAGNOSIS — I340 Nonrheumatic mitral (valve) insufficiency: Secondary | ICD-10-CM | POA: Diagnosis not present

## 2023-11-05 DIAGNOSIS — I5032 Chronic diastolic (congestive) heart failure: Secondary | ICD-10-CM | POA: Diagnosis not present

## 2023-11-14 DIAGNOSIS — I420 Dilated cardiomyopathy: Secondary | ICD-10-CM | POA: Diagnosis not present

## 2023-11-23 ENCOUNTER — Other Ambulatory Visit: Payer: Self-pay | Admitting: Urology

## 2023-11-23 DIAGNOSIS — N401 Enlarged prostate with lower urinary tract symptoms: Secondary | ICD-10-CM

## 2023-11-28 ENCOUNTER — Ambulatory Visit: Payer: Medicare HMO | Admitting: Urology

## 2023-12-10 DIAGNOSIS — E785 Hyperlipidemia, unspecified: Secondary | ICD-10-CM | POA: Diagnosis not present

## 2023-12-10 DIAGNOSIS — I441 Atrioventricular block, second degree: Secondary | ICD-10-CM | POA: Diagnosis not present

## 2023-12-10 DIAGNOSIS — J8410 Pulmonary fibrosis, unspecified: Secondary | ICD-10-CM | POA: Diagnosis not present

## 2023-12-10 DIAGNOSIS — J4489 Other specified chronic obstructive pulmonary disease: Secondary | ICD-10-CM | POA: Diagnosis not present

## 2023-12-10 DIAGNOSIS — I340 Nonrheumatic mitral (valve) insufficiency: Secondary | ICD-10-CM | POA: Diagnosis not present

## 2023-12-10 DIAGNOSIS — Z950 Presence of cardiac pacemaker: Secondary | ICD-10-CM | POA: Diagnosis not present

## 2023-12-10 DIAGNOSIS — G4730 Sleep apnea, unspecified: Secondary | ICD-10-CM | POA: Diagnosis not present

## 2023-12-10 DIAGNOSIS — I10 Essential (primary) hypertension: Secondary | ICD-10-CM | POA: Diagnosis not present

## 2023-12-10 DIAGNOSIS — G720 Drug-induced myopathy: Secondary | ICD-10-CM | POA: Diagnosis not present

## 2023-12-10 DIAGNOSIS — I420 Dilated cardiomyopathy: Secondary | ICD-10-CM | POA: Diagnosis not present

## 2023-12-26 ENCOUNTER — Other Ambulatory Visit (HOSPITAL_BASED_OUTPATIENT_CLINIC_OR_DEPARTMENT_OTHER): Payer: Self-pay

## 2023-12-26 ENCOUNTER — Ambulatory Visit: Payer: HMO | Admitting: Urology

## 2023-12-26 ENCOUNTER — Other Ambulatory Visit: Payer: Self-pay

## 2023-12-26 VITALS — BP 109/67 | HR 92 | Ht 72.0 in | Wt 220.0 lb

## 2023-12-26 DIAGNOSIS — R351 Nocturia: Secondary | ICD-10-CM | POA: Diagnosis not present

## 2023-12-26 DIAGNOSIS — N401 Enlarged prostate with lower urinary tract symptoms: Secondary | ICD-10-CM

## 2023-12-26 LAB — BLADDER SCAN AMB NON-IMAGING

## 2023-12-26 MED ORDER — FINASTERIDE 5 MG PO TABS
5.0000 mg | ORAL_TABLET | Freq: Every day | ORAL | 3 refills | Status: DC
  Filled 2023-12-26: qty 90, 90d supply, fill #0
  Filled 2024-05-12: qty 90, 90d supply, fill #1
  Filled 2024-08-06: qty 90, 90d supply, fill #2
  Filled 2024-10-19: qty 90, 90d supply, fill #3

## 2023-12-26 MED ORDER — FINASTERIDE 5 MG PO TABS
5.0000 mg | ORAL_TABLET | Freq: Every day | ORAL | 3 refills | Status: DC
Start: 2023-12-26 — End: 2023-12-26

## 2023-12-26 NOTE — Progress Notes (Signed)
   12/26/2023 9:35 AM   Randy Lopez 09-Mar-1931 [id number]  Reason for visit: Follow up BPH, nocturia, history of scrotal pain  HPI: 89 year old male with long history of BPH, as well as PVP in 2005 and in 2010 with Dr. Twylla.  Urinary symptoms have been well managed on finasteride .  He did not tolerate alpha blockers in the past secondary to nasal congestion.  He also has a history of a negative prostate biopsy in 2010 at age 65, and we opted to discontinue screening previously per the recommendations of the AUA guidelines.    He also has a history of scrotal pain a few years ago, but that has since resolved, no issues over the last 3 years.  His urinary symptoms have been stable on finasteride  and he really denies any significant complaints today.  Denies any infections or gross hematuria.  PVR is normal at 15 mL.  He reports some mild stress incontinence with physical activity, but this is not particularly bothersome, and he does not need to use a pad.  He has nocturia 2-3 times overnight that is minimally bothersome.  Recently started on diuretics which he feels has increased his frequency after taking the medication.  Recent urinalysis with PCP in July 2024 benign.  Finasteride  refilled RTC 1 year PVR and symptom check   Randy JAYSON Burnet, MD  [facility] [address] [place], [place] [address] [phone]

## 2023-12-28 ENCOUNTER — Other Ambulatory Visit (HOSPITAL_COMMUNITY): Payer: Self-pay

## 2023-12-30 ENCOUNTER — Other Ambulatory Visit (HOSPITAL_COMMUNITY): Payer: Self-pay

## 2023-12-31 ENCOUNTER — Other Ambulatory Visit (HOSPITAL_COMMUNITY): Payer: Self-pay

## 2023-12-31 MED ORDER — TRUEPLUS LANCETS 33G MISC: 1.0000 | Freq: Every day | 1 refills | Status: AC

## 2023-12-31 MED ORDER — GLIMEPIRIDE 4 MG PO TABS
4.0000 mg | ORAL_TABLET | Freq: Every day | ORAL | 3 refills | Status: DC
  Filled 2024-05-12: qty 90, 90d supply, fill #0
  Filled 2024-08-06: qty 90, 90d supply, fill #1
  Filled 2024-11-05: qty 90, 90d supply, fill #2

## 2023-12-31 MED ORDER — FOLIC ACID 400 MCG PO TABS
400.0000 ug | ORAL_TABLET | Freq: Every day | ORAL | 3 refills | Status: DC
  Filled 2024-05-12: qty 90, 90d supply, fill #0

## 2023-12-31 MED ORDER — EMPAGLIFLOZIN 10 MG PO TABS
10.0000 mg | ORAL_TABLET | Freq: Every day | ORAL | 3 refills | Status: DC
  Filled 2024-01-20: qty 90, 90d supply, fill #0

## 2023-12-31 MED ORDER — FINASTERIDE 5 MG PO TABS: 5.0000 mg | ORAL_TABLET | Freq: Every day | ORAL | 3 refills | Status: DC

## 2023-12-31 MED ORDER — METHOTREXATE SODIUM 2.5 MG PO TABS: 15.0000 mg | ORAL_TABLET | ORAL | 1 refills | Status: DC

## 2023-12-31 MED ORDER — TORSEMIDE 20 MG PO TABS
20.0000 mg | ORAL_TABLET | Freq: Every day | ORAL | 5 refills | Status: DC | PRN
  Filled 2024-11-05: qty 60, 60d supply, fill #0

## 2023-12-31 MED ORDER — TRUE METRIX BLOOD GLUCOSE TEST VI STRP: 1.0000 | ORAL_STRIP | Freq: Every day | 3 refills | Status: AC

## 2023-12-31 MED ORDER — PANTOPRAZOLE SODIUM 40 MG PO TBEC: 40.0000 mg | DELAYED_RELEASE_TABLET | Freq: Every day | ORAL | 2 refills | Status: DC

## 2024-01-01 ENCOUNTER — Other Ambulatory Visit (HOSPITAL_COMMUNITY): Payer: Self-pay

## 2024-01-02 ENCOUNTER — Other Ambulatory Visit (HOSPITAL_COMMUNITY): Payer: Self-pay

## 2024-01-13 ENCOUNTER — Other Ambulatory Visit (HOSPITAL_COMMUNITY): Payer: Self-pay

## 2024-01-20 ENCOUNTER — Other Ambulatory Visit: Payer: Self-pay

## 2024-01-20 ENCOUNTER — Other Ambulatory Visit (HOSPITAL_COMMUNITY): Payer: Self-pay

## 2024-01-29 ENCOUNTER — Other Ambulatory Visit: Payer: Self-pay

## 2024-01-29 ENCOUNTER — Other Ambulatory Visit (HOSPITAL_COMMUNITY): Payer: Self-pay

## 2024-01-29 DIAGNOSIS — I10 Essential (primary) hypertension: Secondary | ICD-10-CM | POA: Diagnosis not present

## 2024-01-29 DIAGNOSIS — E1121 Type 2 diabetes mellitus with diabetic nephropathy: Secondary | ICD-10-CM | POA: Diagnosis not present

## 2024-01-29 DIAGNOSIS — Z79899 Other long term (current) drug therapy: Secondary | ICD-10-CM | POA: Diagnosis not present

## 2024-01-29 DIAGNOSIS — M0579 Rheumatoid arthritis with rheumatoid factor of multiple sites without organ or systems involvement: Secondary | ICD-10-CM | POA: Diagnosis not present

## 2024-01-29 DIAGNOSIS — N183 Chronic kidney disease, stage 3 unspecified: Secondary | ICD-10-CM | POA: Diagnosis not present

## 2024-01-29 DIAGNOSIS — E785 Hyperlipidemia, unspecified: Secondary | ICD-10-CM | POA: Diagnosis not present

## 2024-01-29 MED ORDER — METHOTREXATE SODIUM 2.5 MG PO TABS
15.0000 mg | ORAL_TABLET | ORAL | 1 refills | Status: DC
  Filled 2024-01-29: qty 72, 84d supply, fill #0
  Filled 2024-05-12: qty 72, 84d supply, fill #1

## 2024-02-05 DIAGNOSIS — N183 Chronic kidney disease, stage 3 unspecified: Secondary | ICD-10-CM | POA: Diagnosis not present

## 2024-02-05 DIAGNOSIS — J4489 Other specified chronic obstructive pulmonary disease: Secondary | ICD-10-CM | POA: Diagnosis not present

## 2024-02-05 DIAGNOSIS — Z66 Do not resuscitate: Secondary | ICD-10-CM | POA: Diagnosis not present

## 2024-02-05 DIAGNOSIS — I10 Essential (primary) hypertension: Secondary | ICD-10-CM | POA: Diagnosis not present

## 2024-02-05 DIAGNOSIS — G720 Drug-induced myopathy: Secondary | ICD-10-CM | POA: Diagnosis not present

## 2024-02-05 DIAGNOSIS — M0579 Rheumatoid arthritis with rheumatoid factor of multiple sites without organ or systems involvement: Secondary | ICD-10-CM | POA: Diagnosis not present

## 2024-02-05 DIAGNOSIS — J479 Bronchiectasis, uncomplicated: Secondary | ICD-10-CM | POA: Diagnosis not present

## 2024-02-05 DIAGNOSIS — E785 Hyperlipidemia, unspecified: Secondary | ICD-10-CM | POA: Diagnosis not present

## 2024-02-05 DIAGNOSIS — E1121 Type 2 diabetes mellitus with diabetic nephropathy: Secondary | ICD-10-CM | POA: Diagnosis not present

## 2024-02-05 DIAGNOSIS — J8410 Pulmonary fibrosis, unspecified: Secondary | ICD-10-CM | POA: Diagnosis not present

## 2024-02-05 DIAGNOSIS — I420 Dilated cardiomyopathy: Secondary | ICD-10-CM | POA: Diagnosis not present

## 2024-02-05 DIAGNOSIS — D849 Immunodeficiency, unspecified: Secondary | ICD-10-CM | POA: Diagnosis not present

## 2024-02-06 DIAGNOSIS — R609 Edema, unspecified: Secondary | ICD-10-CM | POA: Diagnosis not present

## 2024-02-06 DIAGNOSIS — K219 Gastro-esophageal reflux disease without esophagitis: Secondary | ICD-10-CM | POA: Diagnosis not present

## 2024-02-06 DIAGNOSIS — J849 Interstitial pulmonary disease, unspecified: Secondary | ICD-10-CM | POA: Diagnosis not present

## 2024-03-05 DIAGNOSIS — K219 Gastro-esophageal reflux disease without esophagitis: Secondary | ICD-10-CM | POA: Diagnosis not present

## 2024-03-05 DIAGNOSIS — R1319 Other dysphagia: Secondary | ICD-10-CM | POA: Diagnosis not present

## 2024-03-05 DIAGNOSIS — K224 Dyskinesia of esophagus: Secondary | ICD-10-CM | POA: Diagnosis not present

## 2024-03-10 ENCOUNTER — Other Ambulatory Visit (HOSPITAL_COMMUNITY): Payer: Self-pay

## 2024-03-10 DIAGNOSIS — I5032 Chronic diastolic (congestive) heart failure: Secondary | ICD-10-CM | POA: Diagnosis not present

## 2024-03-10 MED ORDER — JARDIANCE 10 MG PO TABS: 10.0000 mg | ORAL_TABLET | Freq: Every day | ORAL | 3 refills | Status: DC

## 2024-03-11 DIAGNOSIS — J4489 Other specified chronic obstructive pulmonary disease: Secondary | ICD-10-CM | POA: Diagnosis not present

## 2024-03-11 DIAGNOSIS — N183 Chronic kidney disease, stage 3 unspecified: Secondary | ICD-10-CM | POA: Diagnosis not present

## 2024-03-11 DIAGNOSIS — I10 Essential (primary) hypertension: Secondary | ICD-10-CM | POA: Diagnosis not present

## 2024-03-11 DIAGNOSIS — I441 Atrioventricular block, second degree: Secondary | ICD-10-CM | POA: Diagnosis not present

## 2024-03-11 DIAGNOSIS — G4730 Sleep apnea, unspecified: Secondary | ICD-10-CM | POA: Diagnosis not present

## 2024-03-11 DIAGNOSIS — J8410 Pulmonary fibrosis, unspecified: Secondary | ICD-10-CM | POA: Diagnosis not present

## 2024-03-11 DIAGNOSIS — Z45010 Encounter for checking and testing of cardiac pacemaker pulse generator [battery]: Secondary | ICD-10-CM | POA: Diagnosis not present

## 2024-03-11 DIAGNOSIS — I340 Nonrheumatic mitral (valve) insufficiency: Secondary | ICD-10-CM | POA: Diagnosis not present

## 2024-03-11 DIAGNOSIS — I420 Dilated cardiomyopathy: Secondary | ICD-10-CM | POA: Diagnosis not present

## 2024-03-11 DIAGNOSIS — J479 Bronchiectasis, uncomplicated: Secondary | ICD-10-CM | POA: Diagnosis not present

## 2024-03-11 DIAGNOSIS — Z950 Presence of cardiac pacemaker: Secondary | ICD-10-CM | POA: Diagnosis not present

## 2024-03-11 DIAGNOSIS — Z79899 Other long term (current) drug therapy: Secondary | ICD-10-CM | POA: Diagnosis not present

## 2024-03-11 DIAGNOSIS — E785 Hyperlipidemia, unspecified: Secondary | ICD-10-CM | POA: Diagnosis not present

## 2024-03-14 ENCOUNTER — Other Ambulatory Visit (HOSPITAL_COMMUNITY): Payer: Self-pay

## 2024-04-02 ENCOUNTER — Other Ambulatory Visit: Payer: Self-pay

## 2024-04-02 ENCOUNTER — Encounter
Admission: RE | Disposition: A | Discharge status: Home / Self Care | Payer: Self-pay | Source: Ambulatory Visit | Attending: Cardiology

## 2024-04-02 ENCOUNTER — Ambulatory Visit
Admission: RE | Admit: 2024-04-02 | Discharge: 2024-04-02 | Disposition: A | Discharge status: Home / Self Care | Source: Ambulatory Visit | Attending: Cardiology | Admitting: Cardiology

## 2024-04-02 DIAGNOSIS — I441 Atrioventricular block, second degree: Secondary | ICD-10-CM | POA: Diagnosis not present

## 2024-04-02 DIAGNOSIS — Z45010 Encounter for checking and testing of cardiac pacemaker pulse generator [battery]: Secondary | ICD-10-CM | POA: Insufficient documentation

## 2024-04-02 HISTORY — PX: PPM GENERATOR CHANGEOUT: EP1233

## 2024-04-02 LAB — GLUCOSE, CAPILLARY: Glucose-Capillary: 127 mg/dL — ABNORMAL HIGH (ref 70–99)

## 2024-04-02 SURGERY — PPM GENERATOR CHANGEOUT
Anesthesia: Moderate Sedation

## 2024-04-02 MED ORDER — FENTANYL CITRATE (PF) 100 MCG/2ML IJ SOLN
INTRAMUSCULAR | Status: AC
  Filled 2024-04-02: qty 2

## 2024-04-02 MED ORDER — ACETAMINOPHEN 325 MG PO TABS: 325.0000 mg | ORAL_TABLET | ORAL | Status: DC | PRN

## 2024-04-02 MED ORDER — MIDAZOLAM HCL 2 MG/2ML IJ SOLN
INTRAMUSCULAR | Status: DC | PRN
  Administered 2024-04-02: .5 mg via INTRAVENOUS

## 2024-04-02 MED ORDER — FENTANYL CITRATE (PF) 100 MCG/2ML IJ SOLN
INTRAMUSCULAR | Status: DC | PRN
  Administered 2024-04-02: 12.5 ug via INTRAVENOUS

## 2024-04-02 MED ORDER — LIDOCAINE HCL (PF) 1 % IJ SOLN
INTRAMUSCULAR | Status: DC | PRN
  Administered 2024-04-02: 5 mL

## 2024-04-02 MED ORDER — CEPHALEXIN 500 MG PO CAPS
500.0000 mg | ORAL_CAPSULE | Freq: Two times a day (BID) | ORAL | 0 refills | Status: DC
  Filled 2024-04-02: qty 20, 10d supply, fill #0

## 2024-04-02 MED ORDER — MIDAZOLAM HCL 2 MG/2ML IJ SOLN
INTRAMUSCULAR | Status: AC
Start: 2024-04-02 — End: ?
  Filled 2024-04-02: qty 2

## 2024-04-02 MED ORDER — CEFAZOLIN SODIUM-DEXTROSE 2-4 GM/100ML-% IV SOLN: 2.0000 g | INTRAVENOUS | Status: DC

## 2024-04-02 MED ORDER — CEFAZOLIN SODIUM-DEXTROSE 1-4 GM/50ML-% IV SOLN
INTRAVENOUS | Status: DC | PRN
  Administered 2024-04-02: 2 g via INTRAVENOUS

## 2024-04-02 MED ORDER — ONDANSETRON HCL 4 MG/2ML IJ SOLN: 4.0000 mg | Freq: Four times a day (QID) | INTRAMUSCULAR | Status: DC | PRN

## 2024-04-02 MED ORDER — SODIUM CHLORIDE 0.9 % IV SOLN
80.0000 mg | INTRAVENOUS | Status: DC
  Filled 2024-04-02: qty 2

## 2024-04-02 MED ORDER — SODIUM CHLORIDE 0.9 % IV SOLN
INTRAVENOUS | Status: DC | PRN
  Administered 2024-04-02: 80 mg

## 2024-04-02 MED ORDER — SODIUM CHLORIDE 0.9 % IV SOLN: INTRAVENOUS | Status: DC

## 2024-04-02 MED ORDER — LIDOCAINE HCL 1 % IJ SOLN
INTRAMUSCULAR | Status: AC
  Filled 2024-04-02: qty 40

## 2024-04-02 MED ORDER — CEFAZOLIN SODIUM-DEXTROSE 2-4 GM/100ML-% IV SOLN
INTRAVENOUS | Status: AC
  Filled 2024-04-02: qty 100

## 2024-04-02 SURGICAL SUPPLY — 13 items
CABLE SURG 12 DISP A/V CHANNEL (MISCELLANEOUS) IMPLANT
DEVICE DSSCT PLSMBLD 3.0S LGHT (MISCELLANEOUS) IMPLANT
DRAPE INCISE 23X17 STRL (DRAPES) IMPLANT
DRAPE INCISE IOBAN 23X17 STRL (DRAPES) ×1 IMPLANT
IPG PACE AZUR XT DR MRI W1DR01 (Pacemaker) IMPLANT
PACE AZURE XT DR MRI W1DR01 (Pacemaker) ×1 IMPLANT
PAD ELECT DEFIB RADIOL ZOLL (MISCELLANEOUS) IMPLANT
PLASMABLADE 3.0S W/LIGHT (MISCELLANEOUS) ×1 IMPLANT
POUCH AIGIS-R ANTIBACT PPM (Mesh General) ×1 IMPLANT
POUCH AIGIS-R ANTIBACT PPM MED (Mesh General) IMPLANT
SUT VIC AB 2-0 CT1 TAPERPNT 27 (SUTURE) IMPLANT
SUT VIC AB 4-0 PS2 18 (SUTURE) IMPLANT
TRAY PACEMAKER INSERTION (PACKS) ×1 IMPLANT

## 2024-04-02 NOTE — Discharge Instructions (Addendum)
 Patient may shower 04/04/2024.  May remove outer bandage after shower, leave Steri-Strips on.

## 2024-04-03 ENCOUNTER — Encounter: Payer: Self-pay | Admitting: Cardiology

## 2024-04-14 DIAGNOSIS — G4730 Sleep apnea, unspecified: Secondary | ICD-10-CM | POA: Diagnosis not present

## 2024-04-14 DIAGNOSIS — Z950 Presence of cardiac pacemaker: Secondary | ICD-10-CM | POA: Diagnosis not present

## 2024-04-14 DIAGNOSIS — I10 Essential (primary) hypertension: Secondary | ICD-10-CM | POA: Diagnosis not present

## 2024-04-14 DIAGNOSIS — I340 Nonrheumatic mitral (valve) insufficiency: Secondary | ICD-10-CM | POA: Diagnosis not present

## 2024-04-14 DIAGNOSIS — T466X5A Adverse effect of antihyperlipidemic and antiarteriosclerotic drugs, initial encounter: Secondary | ICD-10-CM | POA: Diagnosis not present

## 2024-04-14 DIAGNOSIS — I420 Dilated cardiomyopathy: Secondary | ICD-10-CM | POA: Diagnosis not present

## 2024-04-14 DIAGNOSIS — J4489 Other specified chronic obstructive pulmonary disease: Secondary | ICD-10-CM | POA: Diagnosis not present

## 2024-04-14 DIAGNOSIS — E785 Hyperlipidemia, unspecified: Secondary | ICD-10-CM | POA: Diagnosis not present

## 2024-04-14 DIAGNOSIS — G720 Drug-induced myopathy: Secondary | ICD-10-CM | POA: Diagnosis not present

## 2024-04-14 DIAGNOSIS — J8410 Pulmonary fibrosis, unspecified: Secondary | ICD-10-CM | POA: Diagnosis not present

## 2024-05-12 ENCOUNTER — Other Ambulatory Visit (HOSPITAL_COMMUNITY): Payer: Self-pay

## 2024-05-12 ENCOUNTER — Other Ambulatory Visit: Payer: Self-pay

## 2024-05-12 MED ORDER — PANTOPRAZOLE SODIUM 40 MG PO TBEC
40.0000 mg | DELAYED_RELEASE_TABLET | Freq: Every day | ORAL | 1 refills | Status: DC
  Filled 2024-05-12: qty 90, 90d supply, fill #0
  Filled 2024-10-05: qty 90, 90d supply, fill #1

## 2024-05-19 ENCOUNTER — Other Ambulatory Visit: Payer: Self-pay

## 2024-05-19 ENCOUNTER — Other Ambulatory Visit (HOSPITAL_COMMUNITY): Payer: Self-pay

## 2024-05-19 MED ORDER — JARDIANCE 10 MG PO TABS
10.0000 mg | ORAL_TABLET | Freq: Every day | ORAL | 3 refills | Status: AC
  Filled 2024-05-19: qty 90, 90d supply, fill #0
  Filled 2024-08-06: qty 90, 90d supply, fill #1
  Filled 2024-11-05: qty 90, 90d supply, fill #2

## 2024-07-15 ENCOUNTER — Other Ambulatory Visit: Payer: Self-pay

## 2024-07-15 ENCOUNTER — Other Ambulatory Visit (HOSPITAL_COMMUNITY): Payer: Self-pay

## 2024-07-15 DIAGNOSIS — Z79899 Other long term (current) drug therapy: Secondary | ICD-10-CM | POA: Diagnosis not present

## 2024-07-15 DIAGNOSIS — M0579 Rheumatoid arthritis with rheumatoid factor of multiple sites without organ or systems involvement: Secondary | ICD-10-CM | POA: Diagnosis not present

## 2024-07-15 DIAGNOSIS — M19232 Secondary osteoarthritis, left wrist: Secondary | ICD-10-CM | POA: Diagnosis not present

## 2024-07-15 MED ORDER — METHOTREXATE SODIUM 2.5 MG PO TABS
15.0000 mg | ORAL_TABLET | ORAL | 1 refills | Status: AC
  Filled 2024-07-15: qty 72, 84d supply, fill #0
  Filled 2024-10-05: qty 72, 84d supply, fill #1

## 2024-07-15 MED ORDER — FOLIC ACID 1 MG PO TABS
1.0000 mg | ORAL_TABLET | Freq: Every day | ORAL | 3 refills | Status: AC
  Filled 2024-07-15: qty 90, 90d supply, fill #0
  Filled 2024-10-05: qty 90, 90d supply, fill #1
  Filled 2025-01-19: qty 90, 90d supply, fill #2

## 2024-07-15 MED ORDER — DICLOFENAC SODIUM 1 % EX GEL
2.0000 g | Freq: Three times a day (TID) | CUTANEOUS | 11 refills | Status: AC
  Filled 2024-07-15: qty 100, 16d supply, fill #0
  Filled 2024-07-27: qty 100, 16d supply, fill #1
  Filled 2024-11-27: qty 100, 16d supply, fill #2

## 2024-07-27 ENCOUNTER — Other Ambulatory Visit (HOSPITAL_BASED_OUTPATIENT_CLINIC_OR_DEPARTMENT_OTHER): Payer: Self-pay

## 2024-07-27 ENCOUNTER — Other Ambulatory Visit (HOSPITAL_COMMUNITY): Payer: Self-pay

## 2024-07-28 DIAGNOSIS — E785 Hyperlipidemia, unspecified: Secondary | ICD-10-CM | POA: Diagnosis not present

## 2024-07-28 DIAGNOSIS — E1121 Type 2 diabetes mellitus with diabetic nephropathy: Secondary | ICD-10-CM | POA: Diagnosis not present

## 2024-07-28 DIAGNOSIS — M0579 Rheumatoid arthritis with rheumatoid factor of multiple sites without organ or systems involvement: Secondary | ICD-10-CM | POA: Diagnosis not present

## 2024-07-28 DIAGNOSIS — N183 Chronic kidney disease, stage 3 unspecified: Secondary | ICD-10-CM | POA: Diagnosis not present

## 2024-08-04 DIAGNOSIS — J479 Bronchiectasis, uncomplicated: Secondary | ICD-10-CM | POA: Diagnosis not present

## 2024-08-04 DIAGNOSIS — G720 Drug-induced myopathy: Secondary | ICD-10-CM | POA: Diagnosis not present

## 2024-08-04 DIAGNOSIS — J8410 Pulmonary fibrosis, unspecified: Secondary | ICD-10-CM | POA: Diagnosis not present

## 2024-08-04 DIAGNOSIS — I420 Dilated cardiomyopathy: Secondary | ICD-10-CM | POA: Diagnosis not present

## 2024-08-04 DIAGNOSIS — N183 Chronic kidney disease, stage 3 unspecified: Secondary | ICD-10-CM | POA: Diagnosis not present

## 2024-08-04 DIAGNOSIS — I10 Essential (primary) hypertension: Secondary | ICD-10-CM | POA: Diagnosis not present

## 2024-08-04 DIAGNOSIS — D849 Immunodeficiency, unspecified: Secondary | ICD-10-CM | POA: Diagnosis not present

## 2024-08-04 DIAGNOSIS — M0579 Rheumatoid arthritis with rheumatoid factor of multiple sites without organ or systems involvement: Secondary | ICD-10-CM | POA: Diagnosis not present

## 2024-08-04 DIAGNOSIS — J4489 Other specified chronic obstructive pulmonary disease: Secondary | ICD-10-CM | POA: Diagnosis not present

## 2024-08-04 DIAGNOSIS — I340 Nonrheumatic mitral (valve) insufficiency: Secondary | ICD-10-CM | POA: Diagnosis not present

## 2024-08-04 DIAGNOSIS — E1121 Type 2 diabetes mellitus with diabetic nephropathy: Secondary | ICD-10-CM | POA: Diagnosis not present

## 2024-08-04 DIAGNOSIS — Z0000 Encounter for general adult medical examination without abnormal findings: Secondary | ICD-10-CM | POA: Diagnosis not present

## 2024-08-06 ENCOUNTER — Other Ambulatory Visit (HOSPITAL_COMMUNITY): Payer: Self-pay

## 2024-08-06 ENCOUNTER — Encounter: Payer: Self-pay | Admitting: Urology

## 2024-08-12 DIAGNOSIS — G4730 Sleep apnea, unspecified: Secondary | ICD-10-CM | POA: Diagnosis not present

## 2024-08-12 DIAGNOSIS — N183 Chronic kidney disease, stage 3 unspecified: Secondary | ICD-10-CM | POA: Diagnosis not present

## 2024-08-12 DIAGNOSIS — I420 Dilated cardiomyopathy: Secondary | ICD-10-CM | POA: Diagnosis not present

## 2024-08-12 DIAGNOSIS — I10 Essential (primary) hypertension: Secondary | ICD-10-CM | POA: Diagnosis not present

## 2024-08-12 DIAGNOSIS — E785 Hyperlipidemia, unspecified: Secondary | ICD-10-CM | POA: Diagnosis not present

## 2024-08-12 DIAGNOSIS — E1121 Type 2 diabetes mellitus with diabetic nephropathy: Secondary | ICD-10-CM | POA: Diagnosis not present

## 2024-08-12 DIAGNOSIS — J479 Bronchiectasis, uncomplicated: Secondary | ICD-10-CM | POA: Diagnosis not present

## 2024-08-12 DIAGNOSIS — J4489 Other specified chronic obstructive pulmonary disease: Secondary | ICD-10-CM | POA: Diagnosis not present

## 2024-08-12 DIAGNOSIS — G720 Drug-induced myopathy: Secondary | ICD-10-CM | POA: Diagnosis not present

## 2024-08-12 DIAGNOSIS — J8410 Pulmonary fibrosis, unspecified: Secondary | ICD-10-CM | POA: Diagnosis not present

## 2024-08-12 DIAGNOSIS — Z950 Presence of cardiac pacemaker: Secondary | ICD-10-CM | POA: Diagnosis not present

## 2024-08-12 DIAGNOSIS — I340 Nonrheumatic mitral (valve) insufficiency: Secondary | ICD-10-CM | POA: Diagnosis not present

## 2024-08-13 DIAGNOSIS — D485 Neoplasm of uncertain behavior of skin: Secondary | ICD-10-CM | POA: Diagnosis not present

## 2024-08-13 DIAGNOSIS — L820 Inflamed seborrheic keratosis: Secondary | ICD-10-CM | POA: Diagnosis not present

## 2024-08-13 DIAGNOSIS — B009 Herpesviral infection, unspecified: Secondary | ICD-10-CM | POA: Diagnosis not present

## 2024-08-20 DIAGNOSIS — N183 Chronic kidney disease, stage 3 unspecified: Secondary | ICD-10-CM | POA: Diagnosis not present

## 2024-09-01 DIAGNOSIS — I420 Dilated cardiomyopathy: Secondary | ICD-10-CM | POA: Diagnosis not present

## 2024-10-05 ENCOUNTER — Other Ambulatory Visit (HOSPITAL_COMMUNITY): Payer: Self-pay

## 2024-10-08 ENCOUNTER — Encounter: Payer: Self-pay | Admitting: Urology

## 2024-10-08 ENCOUNTER — Ambulatory Visit: Admitting: Urology

## 2024-10-08 VITALS — BP 142/80 | HR 84 | Ht 72.0 in | Wt 210.0 lb

## 2024-10-08 DIAGNOSIS — N476 Balanoposthitis: Secondary | ICD-10-CM

## 2024-10-08 DIAGNOSIS — R3129 Other microscopic hematuria: Secondary | ICD-10-CM

## 2024-10-08 LAB — URINALYSIS, COMPLETE
Bilirubin, UA: NEGATIVE
Ketones, UA: NEGATIVE
Leukocytes,UA: NEGATIVE
Nitrite, UA: NEGATIVE
Protein,UA: NEGATIVE
Specific Gravity, UA: 1.01 (ref 1.005–1.030)
Urobilinogen, Ur: 0.2 mg/dL (ref 0.2–1.0)
pH, UA: 6 (ref 5.0–7.5)

## 2024-10-08 LAB — MICROSCOPIC EXAMINATION

## 2024-10-08 MED ORDER — BETAMETHASONE DIPROPIONATE 0.05 % EX CREA: TOPICAL_CREAM | Freq: Two times a day (BID) | CUTANEOUS | 0 refills | Status: AC

## 2024-10-08 MED ORDER — NYSTATIN 100000 UNIT/GM EX CREA
1.0000 | TOPICAL_CREAM | Freq: Two times a day (BID) | CUTANEOUS | 0 refills | Status: AC
Start: 2024-10-08 — End: ?

## 2024-10-08 NOTE — Patient Instructions (Addendum)
 betamethasone dipropionate 0.05 % cream on the foreskin twice daily  nystatin cream inside the foreskin with a Q-tip twice daily

## 2024-10-08 NOTE — Progress Notes (Signed)
 10/08/2024 10:26 AM   Randy Lopez 1931-11-29 [id number]  Referring provider: [name], MD [address] [facility] [place],  [place] [address]  Urological history: 1. BPH with LU TS - PVP (2005) and (2010) - Could not tolerate alpha blockers - Currently on finasteride  5 mg daily  2. Elevated PSA - Negative prostate biopsy in 2010 - Discontinue screening secondary to age  89. Remote history of scrotal pain  Chief Complaint  Patient presents with   Balanoposthitis   HPI: Randy Lopez is a 89 y.o. man who presents today for a blister on the penis and spots of blood,   Previous records reviewed.  He has been having issues with his foreskin for the last several weeks.  He states he is very cognizant on hygiene and has been using over-the-counter creams, but he has not seen any improvement.  He states the tip of his penis feels raw.  He is having a difficult time retracting the foreskin.  And the skin is now cracking and bleeding.  Patient denies any modifying or aggravating factors.  Patient denies any recent UTI's, gross hematuria, dysuria or suprapubic/flank pain.  Patient denies any fevers, chills, nausea or vomiting.    UA yellow clear, specific gravity 1.010, pH 6.0, 3+ glucose, 1+ blood, 0 WBCs, 3-10 RBCs, 0-2 epithelial cells, mucus threads present, few bacteria and yeast present.  PMH: Past Medical History:  Diagnosis Date   Acute respiratory failure (HCC)    Anemia, unspecified    Arrhythmia    A-Fib   BPH (benign prostatic hyperplasia)    Chronic kidney disease    Coronary atherosclerosis of native coronary artery    Dupuytren's contracture    ED (erectile dysfunction)    Essential hypertension, benign    Other and unspecified hyperlipidemia    Rheumatoid arthritis(714.0)    Sleep apnea    Trigger finger (acquired)    Type II or unspecified type diabetes mellitus without mention of complication, not stated as uncontrolled      Surgical History: Past Surgical History:  Procedure Laterality Date   ESOPHAGOGASTRODUODENOSCOPY (EGD) WITH PROPOFOL  N/A 05/30/2016   Procedure: ESOPHAGOGASTRODUODENOSCOPY (EGD) WITH PROPOFOL ;  Surgeon: [name], MD;  Location: [facility] ENDOSCOPY;  Service: Endoscopy;  Laterality: N/A;   INSERT / REPLACE / REMOVE PACEMAKER     PPM GENERATOR CHANGEOUT N/A 04/02/2024   Procedure: PPM GENERATOR CHANGEOUT;  Surgeon: [name], MD;  Location: [facility] INVASIVE CV LAB;  Service: Cardiovascular;  Laterality: N/A;    Home Medications:  Allergies as of 10/08/2024       Reactions   Atorvastatin    Other reaction(s): Muscle Pain   Lipitor [atorvastatin Calcium]    Tamsulosin    Other reaction(s): Other (See Comments) Nasal congestion        Medication List        Accurate as of October 08, 2024 10:26 AM. If you have any questions, ask your nurse or doctor.          STOP taking these medications    cephALEXin  500 MG capsule Commonly known as: KEFLEX  Stopped by: CLOTILDA CORNWALL       TAKE these medications    aspirin 81 MG tablet Take 81 mg by mouth daily.   betamethasone dipropionate 0.05 % cream Apply topically 2 (two) times daily. To the foreskin Started by: CLOTILDA CORNWALL   carboxymethylcellulose 0.5 % Soln Commonly known as: REFRESH PLUS Place 1 drop into both  eyes daily as needed (dry eyes).   diclofenac  Sodium 1 % Gel Commonly known as: VOLTAREN  Apply 2 grams topically to the affected areas 3 (three) times daily.   finasteride  5 MG tablet Commonly known as: PROSCAR  Take 1 tablet (5 mg total) by mouth daily.   folic acid  400 MCG tablet Commonly known as: FOLVITE  Take 1 tablet (400 mcg total) by mouth daily.   folic acid  1 MG tablet Commonly known as: FOLVITE  Take 1 tablet (1 mg total) by mouth daily.   glimepiride  4 MG tablet Commonly known as: AMARYL  Take 1 tablet (4 mg total) by mouth daily with breakfast.   Jardiance  10 MG Tabs  tablet Generic drug: empagliflozin  Take 1 tablet (10 mg total) by mouth daily.   methotrexate  2.5 MG tablet Commonly known as: RHEUMATREX Take 6 tablets (15 mg total) by mouth every 7 days.   nystatin cream Commonly known as: MYCOSTATIN Apply 1 Application topically 2 (two) times daily. Inside the foreskin with a Q-tip Started by: CLOTILDA CORNWALL   pantoprazole  40 MG tablet Commonly known as: PROTONIX  Take 1 tablet (40 mg total) by mouth daily.   potassium chloride 10 MEQ tablet Commonly known as: KLOR-CON Take 10 mEq by mouth daily.   torsemide  20 MG tablet Commonly known as: DEMADEX  Take 1 tablet (20 mg total) by mouth daily as needed.   True Metrix Air Glucose Meter w/Device Kit as directed Dx. E11.29   True Metrix Blood Glucose Test test strip Generic drug: glucose blood SMARTSIG:Via Meter   True Metrix Blood Glucose Test test strip Generic drug: glucose blood use as directed to test daily   TRUEplus Lancets 33G Misc   TRUEplus Lancets 33G Misc use as directed to test blood sugar daily   Vitamin D3 25 MCG (1000 UT) Caps Take 1,000 Units by mouth daily.        Allergies:  Allergies  Allergen Reactions   Atorvastatin     Other reaction(s): Muscle Pain   Lipitor [Atorvastatin Calcium]    Tamsulosin     Other reaction(s): Other (See Comments) Nasal congestion    Family History: Family History  Problem Relation Age of Onset   Diabetes Mother    Heart Problems Father     Social History:  reports that he quit smoking about 43 years ago. His smoking use included cigarettes. He started smoking about 73 years ago. He has a 30 pack-year smoking history. He has been exposed to tobacco smoke. He has quit using smokeless tobacco. He reports that he does not drink alcohol  and does not use drugs.  ROS: Pertinent ROS in HPI  Physical Exam: BP (!) 142/80   Pulse 84   Ht 6' (1.829 m)   Wt 210 lb (95.3 kg)   SpO2 93%   BMI 28.48 kg/m   Constitutional:   Well nourished. Alert and oriented, No acute distress. HEENT: [place] AT, moist mucus membranes.  Trachea midline Cardiovascular: No clubbing, cyanosis, or edema. Respiratory: Normal respiratory effort, no increased work of breathing. GU: No CVA tenderness.  No bladder fullness or masses.  Patient with uncircumcised phallus. Foreskin not able to be retracted  Urethral meatus is patent.  No penile discharge. No penile lesions or rashes. Foreskin is cracking and bleeding.  Neurologic: Grossly intact, no focal deficits, moving all 4 extremities. Psychiatric: Normal mood and affect.  Laboratory Data: See Epic and HPI   I have reviewed the labs.   Pertinent Imaging: N/A  Assessment & Plan:  1. Balanoposthitis   - Will have him start Diprolene cream to apply on his foreskin twice daily and nystatin cream to apply inside the foreskin with a Q-tip twice daily - He will continue his foreskin hygiene, pulling back the foreskin as far as he can and washing it with soap and water and then dry completely before applying the medication  2. Microscopic hematuria - secondary to the cracked foreskin   Return in about 2 weeks (around 10/22/2024) for repeat exam .  These notes generated with voice recognition software. I apologize for typographical errors.  CLOTILDA HELON RIGGERS  [facility] [address]  [address] [place], [place] [address] [phone]

## 2024-10-13 LAB — CULTURE, URINE COMPREHENSIVE

## 2024-10-14 ENCOUNTER — Ambulatory Visit: Payer: Self-pay | Admitting: Urology

## 2024-10-19 ENCOUNTER — Other Ambulatory Visit (HOSPITAL_BASED_OUTPATIENT_CLINIC_OR_DEPARTMENT_OTHER): Payer: Self-pay

## 2024-10-22 ENCOUNTER — Ambulatory Visit: Admitting: Urology

## 2024-11-05 ENCOUNTER — Other Ambulatory Visit: Payer: Self-pay

## 2024-11-05 ENCOUNTER — Other Ambulatory Visit (HOSPITAL_COMMUNITY): Payer: Self-pay

## 2024-11-27 ENCOUNTER — Other Ambulatory Visit (HOSPITAL_COMMUNITY): Payer: Self-pay

## 2024-11-30 ENCOUNTER — Other Ambulatory Visit: Payer: Self-pay

## 2024-11-30 ENCOUNTER — Other Ambulatory Visit (HOSPITAL_COMMUNITY): Payer: Self-pay

## 2024-11-30 MED ORDER — PIOGLITAZONE HCL 15 MG PO TABS
15.0000 mg | ORAL_TABLET | Freq: Every day | ORAL | 3 refills | Status: AC
  Filled 2024-11-30: qty 90, 90d supply, fill #0

## 2024-12-08 ENCOUNTER — Other Ambulatory Visit: Payer: Self-pay

## 2024-12-08 ENCOUNTER — Emergency Department

## 2024-12-08 ENCOUNTER — Inpatient Hospital Stay
Admission: EM | Admit: 2024-12-08 | Discharge: 2024-12-10 | DRG: 418 | Disposition: A | Attending: Hospitalist | Admitting: Hospitalist

## 2024-12-08 DIAGNOSIS — N400 Enlarged prostate without lower urinary tract symptoms: Secondary | ICD-10-CM | POA: Diagnosis present

## 2024-12-08 DIAGNOSIS — E1165 Type 2 diabetes mellitus with hyperglycemia: Secondary | ICD-10-CM | POA: Diagnosis present

## 2024-12-08 DIAGNOSIS — K8000 Calculus of gallbladder with acute cholecystitis without obstruction: Secondary | ICD-10-CM | POA: Diagnosis present

## 2024-12-08 DIAGNOSIS — M069 Rheumatoid arthritis, unspecified: Secondary | ICD-10-CM | POA: Diagnosis present

## 2024-12-08 DIAGNOSIS — R112 Nausea with vomiting, unspecified: Secondary | ICD-10-CM

## 2024-12-08 DIAGNOSIS — I5022 Chronic systolic (congestive) heart failure: Secondary | ICD-10-CM | POA: Diagnosis present

## 2024-12-08 DIAGNOSIS — I2510 Atherosclerotic heart disease of native coronary artery without angina pectoris: Secondary | ICD-10-CM | POA: Diagnosis present

## 2024-12-08 DIAGNOSIS — I130 Hypertensive heart and chronic kidney disease with heart failure and stage 1 through stage 4 chronic kidney disease, or unspecified chronic kidney disease: Secondary | ICD-10-CM | POA: Diagnosis present

## 2024-12-08 DIAGNOSIS — I5A Non-ischemic myocardial injury (non-traumatic): Secondary | ICD-10-CM | POA: Diagnosis present

## 2024-12-08 DIAGNOSIS — J4489 Other specified chronic obstructive pulmonary disease: Secondary | ICD-10-CM | POA: Diagnosis present

## 2024-12-08 DIAGNOSIS — I10 Essential (primary) hypertension: Secondary | ICD-10-CM | POA: Diagnosis not present

## 2024-12-08 DIAGNOSIS — N1832 Chronic kidney disease, stage 3b: Secondary | ICD-10-CM | POA: Diagnosis present

## 2024-12-08 DIAGNOSIS — K429 Umbilical hernia without obstruction or gangrene: Secondary | ICD-10-CM | POA: Diagnosis present

## 2024-12-08 DIAGNOSIS — I480 Paroxysmal atrial fibrillation: Secondary | ICD-10-CM | POA: Diagnosis present

## 2024-12-08 DIAGNOSIS — K819 Cholecystitis, unspecified: Secondary | ICD-10-CM | POA: Diagnosis present

## 2024-12-08 DIAGNOSIS — I2489 Other forms of acute ischemic heart disease: Secondary | ICD-10-CM | POA: Diagnosis present

## 2024-12-08 DIAGNOSIS — E872 Acidosis, unspecified: Secondary | ICD-10-CM | POA: Diagnosis present

## 2024-12-08 DIAGNOSIS — Z1152 Encounter for screening for COVID-19: Secondary | ICD-10-CM

## 2024-12-08 DIAGNOSIS — K810 Acute cholecystitis: Secondary | ICD-10-CM | POA: Diagnosis not present

## 2024-12-08 DIAGNOSIS — Z950 Presence of cardiac pacemaker: Secondary | ICD-10-CM | POA: Diagnosis not present

## 2024-12-08 DIAGNOSIS — Z79899 Other long term (current) drug therapy: Secondary | ICD-10-CM

## 2024-12-08 DIAGNOSIS — K5900 Constipation, unspecified: Secondary | ICD-10-CM | POA: Diagnosis present

## 2024-12-08 DIAGNOSIS — Z7982 Long term (current) use of aspirin: Secondary | ICD-10-CM

## 2024-12-08 DIAGNOSIS — Z79631 Long term (current) use of antimetabolite agent: Secondary | ICD-10-CM

## 2024-12-08 DIAGNOSIS — I441 Atrioventricular block, second degree: Secondary | ICD-10-CM | POA: Diagnosis present

## 2024-12-08 DIAGNOSIS — R531 Weakness: Secondary | ICD-10-CM

## 2024-12-08 DIAGNOSIS — Z8249 Family history of ischemic heart disease and other diseases of the circulatory system: Secondary | ICD-10-CM

## 2024-12-08 DIAGNOSIS — E1122 Type 2 diabetes mellitus with diabetic chronic kidney disease: Secondary | ICD-10-CM | POA: Diagnosis present

## 2024-12-08 DIAGNOSIS — E785 Hyperlipidemia, unspecified: Secondary | ICD-10-CM | POA: Diagnosis present

## 2024-12-08 DIAGNOSIS — Z7984 Long term (current) use of oral hypoglycemic drugs: Secondary | ICD-10-CM

## 2024-12-08 DIAGNOSIS — K8010 Calculus of gallbladder with chronic cholecystitis without obstruction: Secondary | ICD-10-CM | POA: Diagnosis not present

## 2024-12-08 DIAGNOSIS — Z87891 Personal history of nicotine dependence: Secondary | ICD-10-CM

## 2024-12-08 DIAGNOSIS — J8410 Pulmonary fibrosis, unspecified: Secondary | ICD-10-CM | POA: Diagnosis present

## 2024-12-08 DIAGNOSIS — Z888 Allergy status to other drugs, medicaments and biological substances status: Secondary | ICD-10-CM

## 2024-12-08 DIAGNOSIS — K821 Hydrops of gallbladder: Secondary | ICD-10-CM | POA: Diagnosis present

## 2024-12-08 DIAGNOSIS — K8020 Calculus of gallbladder without cholecystitis without obstruction: Secondary | ICD-10-CM | POA: Diagnosis present

## 2024-12-08 DIAGNOSIS — Z66 Do not resuscitate: Secondary | ICD-10-CM | POA: Diagnosis present

## 2024-12-08 DIAGNOSIS — Z833 Family history of diabetes mellitus: Secondary | ICD-10-CM

## 2024-12-08 LAB — COMPREHENSIVE METABOLIC PANEL WITH GFR
ALT: 79 U/L — ABNORMAL HIGH (ref 0–44)
AST: 133 U/L — ABNORMAL HIGH (ref 15–41)
Albumin: 4 g/dL (ref 3.5–5.0)
Alkaline Phosphatase: 100 U/L (ref 38–126)
Anion gap: 13 (ref 5–15)
BUN: 28 mg/dL — ABNORMAL HIGH (ref 8–23)
CO2: 25 mmol/L (ref 22–32)
Calcium: 9.6 mg/dL (ref 8.9–10.3)
Chloride: 101 mmol/L (ref 98–111)
Creatinine, Ser: 1.43 mg/dL — ABNORMAL HIGH (ref 0.61–1.24)
GFR, Estimated: 46 mL/min — ABNORMAL LOW (ref >60)
Glucose, Bld: 210 mg/dL — ABNORMAL HIGH (ref 70–99)
Potassium: 3.9 mmol/L (ref 3.5–5.1)
Sodium: 139 mmol/L (ref 135–145)
Total Bilirubin: 1.7 mg/dL — ABNORMAL HIGH (ref 0.0–1.2)
Total Protein: 7.5 g/dL (ref 6.5–8.1)

## 2024-12-08 LAB — CBC WITH DIFFERENTIAL/PLATELET
Abs Immature Granulocytes: 0.11 K/uL — ABNORMAL HIGH (ref 0.00–0.07)
Basophils Absolute: 0 K/uL (ref 0.0–0.1)
Basophils Relative: 0 %
Eosinophils Absolute: 0 K/uL (ref 0.0–0.5)
Eosinophils Relative: 0 %
HCT: 38.2 % — ABNORMAL LOW (ref 39.0–52.0)
Hemoglobin: 12.6 g/dL — ABNORMAL LOW (ref 13.0–17.0)
Immature Granulocytes: 1 %
Lymphocytes Relative: 3 %
Lymphs Abs: 0.4 K/uL — ABNORMAL LOW (ref 0.7–4.0)
MCH: 30.2 pg (ref 26.0–34.0)
MCHC: 33 g/dL (ref 30.0–36.0)
MCV: 91.6 fL (ref 80.0–100.0)
Monocytes Absolute: 1 K/uL (ref 0.1–1.0)
Monocytes Relative: 7 %
Neutro Abs: 13.3 K/uL — ABNORMAL HIGH (ref 1.7–7.7)
Neutrophils Relative %: 89 %
Platelets: 180 K/uL (ref 150–400)
RBC: 4.17 MIL/uL — ABNORMAL LOW (ref 4.22–5.81)
RDW: 15.1 % (ref 11.5–15.5)
WBC: 14.8 K/uL — ABNORMAL HIGH (ref 4.0–10.5)
nRBC: 0 % (ref 0.0–0.2)

## 2024-12-08 LAB — RESP PANEL BY RT-PCR (RSV, FLU A&B, COVID)  RVPGX2
Influenza A by PCR: NEGATIVE
Influenza B by PCR: NEGATIVE
Resp Syncytial Virus by PCR: NEGATIVE
SARS Coronavirus 2 by RT PCR: NEGATIVE

## 2024-12-08 LAB — TROPONIN T, HIGH SENSITIVITY
Troponin T High Sensitivity: 52 ng/L — ABNORMAL HIGH (ref 0–19)
Troponin T High Sensitivity: 54 ng/L — ABNORMAL HIGH (ref 0–19)

## 2024-12-08 LAB — URINALYSIS, W/ REFLEX TO CULTURE (INFECTION SUSPECTED)
Bacteria, UA: NONE SEEN
Bilirubin Urine: NEGATIVE
Glucose, UA: >=500 mg/dL — AB
Ketones, ur: NEGATIVE mg/dL
Leukocytes,Ua: NEGATIVE
Nitrite: NEGATIVE
Protein, ur: NEGATIVE mg/dL
Specific Gravity, Urine: 1.024 (ref 1.005–1.030)
Squamous Epithelial / HPF: 0 /HPF (ref 0–5)
WBC, UA: 0 WBC/hpf (ref 0–5)
pH: 6 (ref 5.0–8.0)

## 2024-12-08 LAB — MAGNESIUM: Magnesium: 2 mg/dL (ref 1.7–2.4)

## 2024-12-08 LAB — LACTIC ACID, PLASMA
Lactic Acid, Venous: 2.5 mmol/L (ref 0.5–1.9)
Lactic Acid, Venous: 3.8 mmol/L (ref 0.5–1.9)
Lactic Acid, Venous: 2.3 mmol/L (ref 0.5–1.9)

## 2024-12-08 LAB — PROTIME-INR
INR: 1.2 (ref 0.8–1.2)
Prothrombin Time: 16 s — ABNORMAL HIGH (ref 11.4–15.2)

## 2024-12-08 LAB — APTT: aPTT: 31 s (ref 24–36)

## 2024-12-08 LAB — GLUCOSE, CAPILLARY: Glucose-Capillary: 182 mg/dL — ABNORMAL HIGH (ref 70–99)

## 2024-12-08 LAB — PRO BRAIN NATRIURETIC PEPTIDE: Pro Brain Natriuretic Peptide: 2093 pg/mL — ABNORMAL HIGH (ref <300.0)

## 2024-12-08 LAB — LIPASE, BLOOD: Lipase: 36 U/L (ref 11–51)

## 2024-12-08 MED ORDER — ACETAMINOPHEN 325 MG PO TABS: 325.0000 mg | ORAL_TABLET | Freq: Four times a day (QID) | ORAL | Status: DC | PRN

## 2024-12-08 MED ORDER — INSULIN ASPART 100 UNIT/ML IJ SOLN
0.0000 [IU] | Freq: Three times a day (TID) | INTRAMUSCULAR | Status: DC
  Administered 2024-12-09: 17:00:00 2 [IU] via SUBCUTANEOUS
  Administered 2024-12-10: 09:00:00 1 [IU] via SUBCUTANEOUS
  Administered 2024-12-10: 13:00:00 2 [IU] via SUBCUTANEOUS
  Filled 2024-12-08: qty 1
  Filled 2024-12-08 (×2): qty 2

## 2024-12-08 MED ORDER — OXYCODONE HCL 5 MG PO TABS
5.0000 mg | ORAL_TABLET | Freq: Four times a day (QID) | ORAL | Status: DC | PRN
  Administered 2024-12-09 – 2024-12-10 (×2): 5 mg via ORAL
  Filled 2024-12-08 (×2): qty 1

## 2024-12-08 MED ORDER — METHOTREXATE SODIUM 2.5 MG PO TABS
15.0000 mg | ORAL_TABLET | ORAL | Status: AC
  Filled 2024-12-08: qty 6

## 2024-12-08 MED ORDER — MORPHINE SULFATE (PF) 2 MG/ML IV SOLN: 0.5000 mg | INTRAVENOUS | Status: DC | PRN

## 2024-12-08 MED ORDER — PANTOPRAZOLE SODIUM 40 MG PO TBEC
40.0000 mg | DELAYED_RELEASE_TABLET | Freq: Every day | ORAL | Status: DC
  Administered 2024-12-10: 09:00:00 40 mg via ORAL
  Filled 2024-12-08: qty 1

## 2024-12-08 MED ORDER — PIPERACILLIN-TAZOBACTAM 3.375 G IVPB 30 MIN
3.3750 g | Freq: Once | INTRAVENOUS | Status: AC
  Administered 2024-12-08: 19:00:00 3.375 g via INTRAVENOUS
  Filled 2024-12-08: qty 50

## 2024-12-08 MED ORDER — INSULIN ASPART 100 UNIT/ML IJ SOLN: 0.0000 [IU] | Freq: Every day | INTRAMUSCULAR | Status: DC

## 2024-12-08 MED ORDER — ONDANSETRON HCL 4 MG/2ML IJ SOLN: 4.0000 mg | Freq: Three times a day (TID) | INTRAMUSCULAR | Status: DC | PRN

## 2024-12-08 MED ORDER — FOLIC ACID 1 MG PO TABS
1.0000 mg | ORAL_TABLET | Freq: Every day | ORAL | Status: DC
  Administered 2024-12-10: 09:00:00 1 mg via ORAL
  Filled 2024-12-08: qty 1

## 2024-12-08 MED ORDER — PIPERACILLIN-TAZOBACTAM 3.375 G IVPB
3.3750 g | Freq: Three times a day (TID) | INTRAVENOUS | Status: DC
  Administered 2024-12-09 – 2024-12-10 (×5): 3.375 g via INTRAVENOUS
  Filled 2024-12-08 (×5): qty 50

## 2024-12-08 MED ORDER — PIPERACILLIN-TAZOBACTAM 4.5 G IVPB: 4.5000 g | Freq: Once | INTRAVENOUS | Status: DC

## 2024-12-08 MED ORDER — SODIUM CHLORIDE 0.9 % IV SOLN
12.5000 mg | Freq: Once | INTRAVENOUS | Status: AC
  Administered 2024-12-08: 20:00:00 12.5 mg via INTRAVENOUS
  Filled 2024-12-08: qty 12.5

## 2024-12-08 MED ORDER — ONDANSETRON HCL 4 MG/2ML IJ SOLN
4.0000 mg | Freq: Once | INTRAMUSCULAR | Status: AC
  Administered 2024-12-08: 17:00:00 4 mg via INTRAVENOUS
  Filled 2024-12-08: qty 2

## 2024-12-08 MED ORDER — POLYVINYL ALCOHOL 1.4 % OP SOLN: 1.0000 [drp] | Freq: Every day | OPHTHALMIC | Status: DC | PRN

## 2024-12-08 MED ORDER — SODIUM CHLORIDE 0.9 % IV BOLUS
1000.0000 mL | Freq: Once | INTRAVENOUS | Status: AC
  Administered 2024-12-08: 21:00:00 1000 mL via INTRAVENOUS

## 2024-12-08 MED ORDER — LACTATED RINGERS IV SOLN: INTRAVENOUS | Status: DC

## 2024-12-08 NOTE — Plan of Care (Signed)

## 2024-12-08 NOTE — Sepsis Progress Note (Signed)
 Elink monitoring for the code sepsis protocol.   Notified bedside nurse of need to order repeat lactic acid.

## 2024-12-08 NOTE — H&P (Signed)
 History and Physical    Randy Lopez FMW:[id number] DOB: Jul 04, 1931 DOA: 12/08/2024  Referring MD/NP/PA:   PCP: [name], MD   Patient coming from:  The patient is coming from home.     Chief Complaint: Nausea, vomiting, right upper quadrant abdominal pain  HPI: Randy Lopez is a 89 y.o. male with medical history significant of sCHF with EF 45%, HTN, HLD, DM, COPD, pulmonary fibrosis, CAD, s/p for pacemaker placement, GERD, CKD stage IIIb, rheumatoid arthritis, who presents with nausea, vomiting, right upper quadrant abdominal pain.  Patient states that his symptoms started yesterday, including nausea and several episodes of nonbilious nonbloody vomiting, right upper quadrant abdominal pain, generalized weakness.  No diarrhea, fever or chills.  His abdominal pain is constant, aching, moderate, nonradiating, not aggravated or alleviated by any known factors.  No chest pain, cough, SOB.  No symptoms for UTI.  Data reviewed independently and ED Course: pt was found to have WBC 14.8, negative UA, negative PCR for COVID, flu and RSV, BNP 2093, lactic acid 2.5, troponin 52, renal function close to baseline, liver function (ALP 100, AST 133, total bilirubin 1.7, ALT 79), lipase 36.  Temperature normal, blood pressure 139/78, heart rate 90s, RR 18, oxygen saturation 99% on room air.  Patient is admitted to telemetry bed as inpatient.  Dr. Jordis of surgery is consulted   Right upper quadrant ultrasound: 1. Cholelithiasis with multiple stones and sludge in the gallbladder, including a stone in the gallbladder neck. 2. Gallbladder wall thickening measuring up to 9 mm. 3. Findings may represent acute cholecystitis in the appropriate clinical setting.  MRCP pending:      EKG: I have personally reviewed.  Occasional placer mark is seen, left bundle blockade, poor R wave progression, LAD.   Review of Systems:   General: no fevers, chills, no body weight gain, has poor appetite, has  fatigue HEENT: no blurry vision, hearing changes or sore throat Respiratory: no dyspnea, coughing, wheezing CV: no chest pain, no palpitations GI: has nausea, vomiting, abdominal pain, no diarrhea, constipation GU: no dysuria, burning on urination, increased urinary frequency, hematuria  Ext: no leg edema Neuro: no unilateral weakness, numbness, or tingling, no vision change or hearing loss Skin: no rash, no skin tear. MSK: No muscle spasm, no deformity, no limitation of range of movement in spin Heme: No easy bruising.  Travel history: No recent long distant travel.   Allergy: Allergies[1]  Past Medical History:  Diagnosis Date   Acute respiratory failure (HCC)    Anemia, unspecified    Arrhythmia    A-Fib   BPH (benign prostatic hyperplasia)    Chronic kidney disease    Coronary atherosclerosis of native coronary artery    Dupuytren's contracture    ED (erectile dysfunction)    Essential hypertension, benign    Other and unspecified hyperlipidemia    Rheumatoid arthritis(714.0)    Sleep apnea    Trigger finger (acquired)    Type II or unspecified type diabetes mellitus without mention of complication, not stated as uncontrolled     Past Surgical History:  Procedure Laterality Date   ESOPHAGOGASTRODUODENOSCOPY (EGD) WITH PROPOFOL  N/A 05/30/2016   Procedure: ESOPHAGOGASTRODUODENOSCOPY (EGD) WITH PROPOFOL ;  Surgeon: [name], MD;  Location: [facility];  Service: Endoscopy;  Laterality: N/A;   INSERT / REPLACE / REMOVE PACEMAKER     PPM GENERATOR CHANGEOUT N/A 04/02/2024   Procedure: PPM GENERATOR CHANGEOUT;  Surgeon: [name], MD;  Location: [facility] INVASIVE CV LAB;  Service: Cardiovascular;  Laterality: N/A;    Social History:  reports that he quit smoking about 43 years ago. His smoking use included cigarettes. He started smoking about 73 years ago. He has a 30 pack-year smoking history. He has been exposed to tobacco smoke. He has quit using smokeless  tobacco. He reports that he does not drink alcohol  and does not use drugs.  Family History:  Family History  Problem Relation Age of Onset   Diabetes Mother    Heart Problems Father      Prior to Admission medications  Medication Sig Start Date End Date Taking? Authorizing Provider  aspirin 81 MG tablet Take 81 mg by mouth daily.    [provider]  betamethasone  dipropionate 0.05 % cream Apply topically 2 (two) times daily. To the foreskin 10/08/24   Helon Kirsch A, PA-C  carboxymethylcellulose (REFRESH PLUS) 0.5 % SOLN Place 1 drop into both eyes daily as needed (dry eyes).    [provider]  Cholecalciferol (VITAMIN D3) 1000 UNITS CAPS Take 1,000 Units by mouth daily.    [provider]  diclofenac  Sodium (VOLTAREN ) 1 % GEL Apply 2 grams topically to the affected areas 3 (three) times daily. 07/15/24     empagliflozin  (JARDIANCE ) 10 MG TABS tablet Take 1 tablet (10 mg total) by mouth daily. 05/19/24     finasteride  (PROSCAR ) 5 MG tablet Take 1 tablet (5 mg total) by mouth daily. 12/26/23   Francisca Redell BROCKS, MD  folic acid  (FOLVITE ) 1 MG tablet Take 1 tablet (1 mg total) by mouth daily. 07/15/24     folic acid  (FOLVITE ) 400 MCG tablet Take 1 tablet (400 mcg total) by mouth daily. 06/18/23     glimepiride  (AMARYL ) 4 MG tablet Take 1 tablet (4 mg total) by mouth daily with breakfast. 11/26/23     glucose blood (TRUE METRIX BLOOD GLUCOSE TEST) test strip use as directed to test daily 12/09/23     methotrexate  (RHEUMATREX) 2.5 MG tablet Take 6 tablets (15 mg total) by mouth every 7 days. 07/15/24     nystatin  cream (MYCOSTATIN ) Apply 1 Application topically 2 (two) times daily. Inside the foreskin with a Q-tip 10/08/24   McGowan, Kirsch A, PA-C  pantoprazole  (PROTONIX ) 40 MG tablet Take 1 tablet (40 mg total) by mouth daily. 05/12/24     pioglitazone  (ACTOS ) 15 MG tablet Take 1 tablet (15 mg total) by mouth daily. 11/29/24     potassium chloride (KLOR-CON) 10 MEQ tablet  Take 10 mEq by mouth daily. 03/15/23 03/30/24  [provider]  torsemide  (DEMADEX ) 20 MG tablet Take 1 tablet (20 mg total) by mouth daily as needed. 11/25/23     TRUE METRIX BLOOD GLUCOSE TEST test strip SMARTSIG:Via Meter 10/30/22   [provider]  TRUEplus Lancets 33G MISC  10/02/21   [provider]  TRUEplus Lancets 33G MISC use as directed to test blood sugar daily 06/21/23       Physical Exam: Vitals:   12/08/24 1800 12/08/24 1830 12/08/24 1954 12/08/24 2037  BP: (!) 145/72 139/78 (!) 151/70 (!) 150/74  Pulse: 93 90 87 87  Resp:  16 16 18   Temp:   97.8 F (36.6 C) 97.7 F (36.5 C)  TempSrc:   Oral   SpO2: 99% 99% 99% 99%  Weight:    96 kg  Height:    6' (1.829 m)   General: Not in acute distress HEENT:       Eyes: PERRL, EOMI, no jaundice  ENT: No discharge from the ears and nose, no pharynx injection, no tonsillar enlargement.        Neck: No JVD, no bruit, no mass felt. Heme: No neck lymph node enlargement. Cardiac: S1/S2, RRR, No gallops or rubs. Respiratory: No rales, wheezing, rhonchi or rubs. GI: Soft, nondistended, has tenderness in RUQ, no rebound pain, no organomegaly, BS present. GU: No hematuria Ext: No pitting leg edema bilaterally. 1+DP/PT pulse bilaterally. Musculoskeletal: No joint deformities, No joint redness or warmth, no limitation of ROM in spin. Skin: No rashes.  Neuro: Alert, oriented X3, cranial nerves II-XII grossly intact, moves all extremities normally.  Psych: Patient is not psychotic, no suicidal or hemocidal ideation.  Labs on Admission: I have personally reviewed following labs and imaging studies  CBC: Recent Labs  Lab 12/08/24 1645  WBC 14.8*  NEUTROABS 13.3*  HGB 12.6*  HCT 38.2*  MCV 91.6  PLT 180   Basic Metabolic Panel: Recent Labs  Lab 12/08/24 1645  NA 139  K 3.9  CL 101  CO2 25  GLUCOSE 210*  BUN 28*  CREATININE 1.43*  CALCIUM 9.6  MG 2.0   GFR: Estimated Creatinine Clearance:  40.5 mL/min (A) (by C-G formula based on SCr of 1.43 mg/dL (H)). Liver Function Tests: Recent Labs  Lab 12/08/24 1645  AST 133*  ALT 79*  ALKPHOS 100  BILITOT 1.7*  PROT 7.5  ALBUMIN 4.0   Recent Labs  Lab 12/08/24 1645  LIPASE 36   No results for input(s): AMMONIA in the last 168 hours. Coagulation Profile: Recent Labs  Lab 12/08/24 1645  INR 1.2   Cardiac Enzymes: No results for input(s): CKTOTAL, CKMB, CKMBINDEX, TROPONINI in the last 168 hours. BNP (last 3 results) Recent Labs    12/08/24 1645  PROBNP 2,093.0*   HbA1C: No results for input(s): HGBA1C in the last 72 hours. CBG: Recent Labs  Lab 12/08/24 2137  GLUCAP 182*   Lipid Profile: No results for input(s): CHOL, HDL, LDLCALC, TRIG, CHOLHDL, LDLDIRECT in the last 72 hours. Thyroid  Function Tests: No results for input(s): TSH, T4TOTAL, FREET4, T3FREE, THYROIDAB in the last 72 hours. Anemia Panel: No results for input(s): VITAMINB12, FOLATE, FERRITIN, TIBC, IRON, RETICCTPCT in the last 72 hours. Urine analysis:    Component Value Date/Time   COLORURINE YELLOW (A) 12/08/2024 1720   APPEARANCEUR CLEAR (A) 12/08/2024 1720   APPEARANCEUR Clear 10/08/2024 0933   LABSPEC 1.024 12/08/2024 1720   LABSPEC 1.016 06/06/2014 0122   PHURINE 6.0 12/08/2024 1720   GLUCOSEU >=500 (A) 12/08/2024 1720   GLUCOSEU Negative 06/06/2014 0122   HGBUR SMALL (A) 12/08/2024 1720   BILIRUBINUR NEGATIVE 12/08/2024 1720   BILIRUBINUR Negative 10/08/2024 0933   BILIRUBINUR Negative 06/06/2014 0122   KETONESUR NEGATIVE 12/08/2024 1720   PROTEINUR NEGATIVE 12/08/2024 1720   NITRITE NEGATIVE 12/08/2024 1720   LEUKOCYTESUR NEGATIVE 12/08/2024 1720   LEUKOCYTESUR Negative 06/06/2014 0122   Sepsis Labs: @LABRCNTIP (procalcitonin:4,lacticidven:4) ) Recent Results (from the past 240 hours)  Resp panel by RT-PCR (RSV, Flu A&B, Covid) Anterior Nasal Swab     Status: None   Collection  Time: 12/08/24  5:07 PM   Specimen: Anterior Nasal Swab  Result Value Ref Range Status   SARS Coronavirus 2 by RT PCR NEGATIVE NEGATIVE Final    Comment: (NOTE) SARS-CoV-2 target nucleic acids are NOT DETECTED.  The SARS-CoV-2 RNA is generally detectable in upper respiratory specimens during the acute phase of infection. The lowest concentration of SARS-CoV-2 viral copies this assay can detect  is 138 copies/mL. A negative result does not preclude SARS-Cov-2 infection and should not be used as the sole basis for treatment or other patient management decisions. A negative result may occur with  improper specimen collection/handling, submission of specimen other than nasopharyngeal swab, presence of viral mutation(s) within the areas targeted by this assay, and inadequate number of viral copies(<138 copies/mL). A negative result must be combined with clinical observations, patient history, and epidemiological information. The expected result is Negative.  Fact Sheet for Patients:  bloggercourse.com  Fact Sheet for Healthcare Providers:  seriousbroker.it  This test is no t yet approved or cleared by the [place]  FDA and  has been authorized for detection and/or diagnosis of SARS-CoV-2 by FDA under an Emergency Use Authorization (EUA). This EUA will remain  in effect (meaning this test can be used) for the duration of the COVID-19 declaration under Section 564(b)(1) of the Act, 21 U.S.C.section 360bbb-3(b)(1), unless the authorization is terminated  or revoked sooner.       Influenza A by PCR NEGATIVE NEGATIVE Final   Influenza B by PCR NEGATIVE NEGATIVE Final    Comment: (NOTE) The Xpert Xpress SARS-CoV-2/FLU/RSV plus assay is intended as an aid in the diagnosis of influenza from Nasopharyngeal swab specimens and should not be used as a sole basis for treatment. Nasal washings and aspirates are unacceptable for Xpert Xpress  SARS-CoV-2/FLU/RSV testing.  Fact Sheet for Patients: bloggercourse.com  Fact Sheet for Healthcare Providers: seriousbroker.it  This test is not yet approved or cleared by the [place]  FDA and has been authorized for detection and/or diagnosis of SARS-CoV-2 by FDA under an Emergency Use Authorization (EUA). This EUA will remain in effect (meaning this test can be used) for the duration of the COVID-19 declaration under Section 564(b)(1) of the Act, 21 U.S.C. section 360bbb-3(b)(1), unless the authorization is terminated or revoked.     Resp Syncytial Virus by PCR NEGATIVE NEGATIVE Final    Comment: (NOTE) Fact Sheet for Patients: bloggercourse.com  Fact Sheet for Healthcare Providers: seriousbroker.it  This test is not yet approved or cleared by the [place]  FDA and has been authorized for detection and/or diagnosis of SARS-CoV-2 by FDA under an Emergency Use Authorization (EUA). This EUA will remain in effect (meaning this test can be used) for the duration of the COVID-19 declaration under Section 564(b)(1) of the Act, 21 U.S.C. section 360bbb-3(b)(1), unless the authorization is terminated or revoked.  Performed at [facility], [address], [place], [place] [address]      Radiological Exams on Admission:   Assessment/Plan Principal Problem:   Gallstone Active Problems:   Cholecystitis, acute   Chronic systolic CHF (congestive heart failure) (HCC)   CAD (coronary artery disease)   Myocardial injury   COPD with chronic bronchitis (HCC)   Pulmonary fibrosis (HCC)   HTN (hypertension)   Chronic kidney disease, stage 3b (HCC)   Arthritis, rheumatoid (HCC)   PAF (paroxysmal atrial fibrillation) (HCC)   Pacemaker   Assessment and Plan:   Gallstone and cholecystitis, acute:  US  showed cholelithiasis with multiple stones and sludge in the  gallbladder, including a stone in the gallbladder neck, gallbladder wall thickening measuring up to 9 mm, indicating possible cholecystitis.  Patient has a leukocytosis with WBC 14.8, but no fever does not seem to have sepsis..  Lactic acid is elevated at 2.5 --> 3.8 --> 2.3. Dr. Jordis of surgery is consulted, who recommended MRCP. Since pt has PPM, will need to discuss with his cardiologist,  Dr. Ammon to see if patient can do MRCP tomorrow.   -Admitted to telemetry bed as inpatient - IV Zosyn  - Blood culture - As needed morphine , oxycodone  for pain - IVF: 1L of NS, then 50 cc/h of LR - Trend lactic acid level  Chronic systolic CHF (congestive heart failure) (HCC): 2D echo 11/14/2023 showed EF of 45%.  Patient has elevated BNP 2093, but no SOB, no leg edema, does not seem to have CHF exacerbation. - Hold torsemide  since patient need IV fluid  CAD (coronary artery disease) and myocardial injury: Troponin 52 --> 54, likely demand ischemia. -Hold aspirin for possible surgery - Trend troponin  COPD with chronic bronchitis and pulmonary fibrosis: Stable.  No SOB -Bronchodilators as needed Mucinex   HTN (hypertension) -IV hydralazine as needed - Hold Lasix as above  Chronic kidney disease, stage 3b [facility]): Renal function stable.  Recent baseline creatinine 1.7 on 08/20/2024.  His creatinine is 1.43, BUN 28, GFR 46. -Follow-up with BMP  Arthritis, rheumatoid (HCC) -Methotrexate  weekly  PAF (paroxysmal atrial fibrillation) [facility]): Patient is not taking anticoagulants.  Heart rate 90s. -Monitor heart rate closely  Pacemaker in place:      DVT ppx: SCD  Code Status:  DNR (I discussed with patient and explained the meaning of CODE STATUS. Patient wants to be DNR)    Family Communication:     not done, no family member is at bed side.     Disposition Plan:  Anticipate discharge back to previous environment  Consults called: Dr. Jordis of surgery  Admission status and Level of  care: Telemetryas inpt        Dispo: The patient is from: Home              Anticipated d/c is to: Home              Anticipated d/c date is: 2 days              Patient currently is not medically stable to d/c.    Severity of Illness:  The appropriate patient status for this patient is INPATIENT. Inpatient status is judged to be reasonable and necessary in order to provide the required intensity of service to ensure the patient's safety. The patient's presenting symptoms, physical exam findings, and initial radiographic and laboratory data in the context of their chronic comorbidities is felt to place them at high risk for further clinical deterioration. Furthermore, it is not anticipated that the patient will be medically stable for discharge from the hospital within 2 midnights of admission.   * I certify that at the point of admission it is my clinical judgment that the patient will require inpatient hospital care spanning beyond 2 midnights from the point of admission due to high intensity of service, high risk for further deterioration and high frequency of surveillance required.*       Date of Service 12/09/2024    Caleb Exon [facility]   If 7PM-7AM, please contact night-coverage [email or web address] 12/09/2024, 1:21 AM     [1]  Allergies Allergen Reactions   Atorvastatin     Other reaction(s): Muscle Pain   Lipitor [Atorvastatin Calcium]    Tamsulosin     Other reaction(s): Other (See Comments) Nasal congestion

## 2024-12-08 NOTE — ED Provider Notes (Signed)
 [facility] Provider Note    Event Date/Time   First MD Initiated Contact with Patient 12/08/24 1624     (approximate)   History   Weakness   HPI  Angello MARQUAIL BRADWELL is a 89 y.o. male past medical history significant for dual pacemaker, hypertension, hyperlipidemia, diabetes, rheumatoid arthritis, HFrEF with an EF of 45%, atrial fibrillation, COPD, pulmonary fibrosis, [employer] pacemaker, presents to the emergency department with weakness.  Patient states he started feeling poorly last night.  Multiple episodes of nausea and vomiting and states that he was throwing up all night and unable to keep anything down.  Ongoing nausea today.  Complaining of some pain to his right side of his abdomen that is worse in his right upper abdomen.  History of chronic constipation.  No history of a bowel obstruction.  Passing some flatus.  Denies any blood in his stool or melanotic stool.  Denies any fever or chills.  No prior abdominal surgery.  No diarrhea.  No chest pain or shortness of breath.  No cough.     Physical Exam   Triage Vital Signs: ED Triage Vitals  Encounter Vitals Group     BP 12/08/24 1630 (!) 152/80     Girls Systolic BP Percentile --      Girls Diastolic BP Percentile --      Boys Systolic BP Percentile --      Boys Diastolic BP Percentile --      Pulse Rate 12/08/24 1630 90     Resp 12/08/24 1630 11     Temp 12/08/24 1630 97.7 F (36.5 C)     Temp Source 12/08/24 1630 Oral     SpO2 12/08/24 1630 100 %     Weight 12/08/24 1629 213 lb 3 oz (96.7 kg)     Height 12/08/24 1629 6' (1.829 m)     Head Circumference --      Peak Flow --      Pain Score 12/08/24 1629 8     Pain Loc --      Pain Education --      Exclude from Growth Chart --     Most recent vital signs: Vitals:   12/08/24 1800 12/08/24 1830  BP: (!) 145/72 139/78  Pulse: 93 90  Resp:  16  Temp:    SpO2: 99% 99%    Physical Exam Constitutional:      Appearance: He is  well-developed.  HENT:     Head: Atraumatic.     Mouth/Throat:     Mouth: Mucous membranes are dry.  Eyes:     Conjunctiva/sclera: Conjunctivae normal.  Cardiovascular:     Rate and Rhythm: Regular rhythm.  Pulmonary:     Effort: No respiratory distress.  Abdominal:     Tenderness: There is abdominal tenderness (Right upper quadrant abdomen abdominal tenderness to palpation with no rebound or guarding.  Negative Murphy sign.).  Musculoskeletal:     Cervical back: Normal range of motion.  Skin:    General: Skin is warm.     Capillary Refill: Capillary refill takes less than 2 seconds.     Coloration: Skin is pale.  Neurological:     Mental Status: He is alert. Mental status is at baseline.     IMPRESSION / MDM / ASSESSMENT AND PLAN / ED COURSE  I reviewed the triage vital signs and the nursing notes.  On arrival afebrile, hemodynamically stable.  Differential diagnosis including acute cholecystitis, symptomatic cholelithiasis, ACS, gastritis/PUD,  small bowel obstruction, viral illness including COVID/influenza  EKG  I, Clotilda Punter, the attending physician, personally viewed and interpreted this ECG.  EKG consistent with paced rhythm.  Negative Sgarbossa's criteria.  Reading acute MI however disagree with the read.  Similar to prior EKG.  QTc 511.  No tachycardic or bradycardic dysrhythmias while on cardiac telemetry.  RADIOLOGY Right upper quadrant ultrasound -cholelithiasis with multiple gallstones and sludge in the gallbladder, stone in the gallbladder neck.  Gallbladder wall thickening measuring up to 9 mm.  Concern for acute cholecystitis.  Common bile duct 4 mm.  LABS (all labs ordered are listed, but only abnormal results are displayed) Labs interpreted as -    Labs Reviewed  CBC WITH DIFFERENTIAL/PLATELET - Abnormal; Notable for the following components:      Result Value   WBC 14.8 (*)    RBC 4.17 (*)    Hemoglobin 12.6 (*)    HCT 38.2 (*)    Neutro Abs  13.3 (*)    Lymphs Abs 0.4 (*)    Abs Immature Granulocytes 0.11 (*)    All other components within normal limits  COMPREHENSIVE METABOLIC PANEL WITH GFR - Abnormal; Notable for the following components:   Glucose, Bld 210 (*)    BUN 28 (*)    Creatinine, Ser 1.43 (*)    AST 133 (*)    ALT 79 (*)    Total Bilirubin 1.7 (*)    GFR, Estimated 46 (*)    All other components within normal limits  URINALYSIS, W/ REFLEX TO CULTURE (INFECTION SUSPECTED) - Abnormal; Notable for the following components:   Color, Urine YELLOW (*)    APPearance CLEAR (*)    Glucose, UA >=500 (*)    Hgb urine dipstick SMALL (*)    All other components within normal limits  LACTIC ACID, PLASMA - Abnormal; Notable for the following components:   Lactic Acid, Venous 2.5 (*)    All other components within normal limits  TROPONIN T, HIGH SENSITIVITY - Abnormal; Notable for the following components:   Troponin T High Sensitivity 52 (*)    All other components within normal limits  RESP PANEL BY RT-PCR (RSV, FLU A&B, COVID)  [id number]  CULTURE, BLOOD (ROUTINE X 2)  CULTURE, BLOOD (ROUTINE X 2)  LIPASE, BLOOD  LACTIC ACID, PLASMA     MDM  Given IV antiemetics  6:55 PM Concern for sepsis.  Leukocytosis and elevated lactic acid.  Added on blood cultures.  Started on IV Zosyn  to cover for intra-abdominal source with findings concerning for acute cholecystitis.  Discussed patient's case with general surgery Dr. Pabone -recommended MRCP and admission to the hospitalist with IV antibiotics.  Unable to do MRCP until tomorrow given that the patient has a pacemaker and will need to be evaluated by cardiology for MRI.  Given this information to the surgeon.  Consulted hospitalist for admission for concerns for acute cholecystitis.  Leukocytosis and lactic acid elevated.  Mild elevation of troponin to 52.  Likely demand and feel that is more consistent with his acute cholecystitis.  Will repeat troponin.  COVID influenza  testing negative.  No findings of a urinary tract infection.  Hyperglycemia but no signs of DKA.     PROCEDURES:  Critical Care performed: yes  .Critical Care  Performed by: Punter Clotilda, MD Authorized by: Punter Clotilda, MD   Critical care provider statement:    Critical care time (minutes):  30   Critical care was necessary to treat or prevent  imminent or life-threatening deterioration of the following conditions:  Sepsis   Critical care was time spent personally by me on the following activities:  Development of treatment plan with patient or surrogate, discussions with consultants, evaluation of patient's response to treatment, examination of patient, ordering and review of laboratory studies, ordering and review of radiographic studies, ordering and performing treatments and interventions, pulse oximetry, re-evaluation of patient's condition and review of old charts   Patient's presentation is most consistent with acute presentation with potential threat to life or bodily function.   MEDICATIONS ORDERED IN ED: Medications  lactated ringers  infusion (has no administration in time range)  piperacillin -tazobactam (ZOSYN ) IVPB 3.375 g (has no administration in time range)  ondansetron  (ZOFRAN ) injection 4 mg (4 mg Intravenous Given 12/08/24 1711)    FINAL CLINICAL IMPRESSION(S) / ED DIAGNOSES   Final diagnoses:  Weakness  Nausea and vomiting, unspecified vomiting type  Cholecystitis     Rx / DC Orders   ED Discharge Orders     None        Note:  This document was prepared using Dragon voice recognition software and may include unintentional dictation errors.   Suzanne Kirsch, MD 12/08/24 [phone]

## 2024-12-08 NOTE — ED Triage Notes (Signed)
 Patient arrives from home via [facility] with complaints of increased weakness associated with nausea and vomiting. Patient states he has not been able to keep any food or fluids down in the last 24 hours. Patient states the nausea and vomiting starting yesterday morning. Patient denies diarrhea. Patient does endorses lower right sided abdominal pain.

## 2024-12-08 NOTE — Progress Notes (Signed)
 CODE SEPSIS - PHARMACY COMMUNICATION  **Broad Spectrum Antibiotics should be administered within 1 hour of Sepsis diagnosis**  Time Code Sepsis Called/Page Received: 8161  Antibiotics Ordered: Zosyn   Time of 1st antibiotic administration: 1905  Additional action taken by pharmacy: -  If necessary, Name of Provider/Nurse Contacted: -    Lum VEAR Mania ,PharmD Clinical Pharmacist  12/08/2024  6:40 PM

## 2024-12-08 NOTE — ED Notes (Signed)
 Patient is aware of the shared room and is in agreement to admission to room 205

## 2024-12-09 ENCOUNTER — Inpatient Hospital Stay

## 2024-12-09 ENCOUNTER — Inpatient Hospital Stay: Admitting: Anesthesiology

## 2024-12-09 ENCOUNTER — Encounter: Payer: Self-pay | Admitting: Internal Medicine

## 2024-12-09 ENCOUNTER — Encounter
Admission: EM | Disposition: A | Discharge status: Home / Self Care | Payer: Self-pay | Source: Home / Self Care | Attending: Hospitalist

## 2024-12-09 DIAGNOSIS — K8000 Calculus of gallbladder with acute cholecystitis without obstruction: Secondary | ICD-10-CM | POA: Diagnosis not present

## 2024-12-09 DIAGNOSIS — I480 Paroxysmal atrial fibrillation: Secondary | ICD-10-CM | POA: Diagnosis not present

## 2024-12-09 DIAGNOSIS — I5022 Chronic systolic (congestive) heart failure: Secondary | ICD-10-CM | POA: Diagnosis not present

## 2024-12-09 DIAGNOSIS — K810 Acute cholecystitis: Secondary | ICD-10-CM

## 2024-12-09 HISTORY — PX: INTRAOPERATIVE CHOLANGIOGRAM: SHX5230

## 2024-12-09 HISTORY — PX: CHOLECYSTECTOMY: SHX55

## 2024-12-09 LAB — COMPREHENSIVE METABOLIC PANEL WITH GFR
ALT: 99 U/L — ABNORMAL HIGH (ref 0–44)
AST: 114 U/L — ABNORMAL HIGH (ref 15–41)
Albumin: 3.7 g/dL (ref 3.5–5.0)
Alkaline Phosphatase: 101 U/L (ref 38–126)
Anion gap: 12 (ref 5–15)
BUN: 27 mg/dL — ABNORMAL HIGH (ref 8–23)
CO2: 25 mmol/L (ref 22–32)
Calcium: 9.2 mg/dL (ref 8.9–10.3)
Chloride: 101 mmol/L (ref 98–111)
Creatinine, Ser: 1.42 mg/dL — ABNORMAL HIGH (ref 0.61–1.24)
GFR, Estimated: 47 mL/min — ABNORMAL LOW (ref >60)
Glucose, Bld: 151 mg/dL — ABNORMAL HIGH (ref 70–99)
Potassium: 3.9 mmol/L (ref 3.5–5.1)
Sodium: 138 mmol/L (ref 135–145)
Total Bilirubin: 1.5 mg/dL — ABNORMAL HIGH (ref 0.0–1.2)
Total Protein: 6.9 g/dL (ref 6.5–8.1)

## 2024-12-09 LAB — TROPONIN T, HIGH SENSITIVITY
Troponin T High Sensitivity: 69 ng/L — ABNORMAL HIGH (ref 0–19)
Troponin T High Sensitivity: 75 ng/L — ABNORMAL HIGH (ref 0–19)
Troponin T High Sensitivity: 77 ng/L — ABNORMAL HIGH (ref 0–19)
Troponin T High Sensitivity: 67 ng/L — ABNORMAL HIGH (ref 0–19)

## 2024-12-09 LAB — CBC
HCT: 36.2 % — ABNORMAL LOW (ref 39.0–52.0)
Hemoglobin: 12.4 g/dL — ABNORMAL LOW (ref 13.0–17.0)
MCH: 30.8 pg (ref 26.0–34.0)
MCHC: 34.3 g/dL (ref 30.0–36.0)
MCV: 89.8 fL (ref 80.0–100.0)
Platelets: 172 K/uL (ref 150–400)
RBC: 4.03 MIL/uL — ABNORMAL LOW (ref 4.22–5.81)
RDW: 15.5 % (ref 11.5–15.5)
WBC: 18.2 K/uL — ABNORMAL HIGH (ref 4.0–10.5)
nRBC: 0 % (ref 0.0–0.2)

## 2024-12-09 LAB — GLUCOSE, CAPILLARY
Glucose-Capillary: 155 mg/dL — ABNORMAL HIGH (ref 70–99)
Glucose-Capillary: 145 mg/dL — ABNORMAL HIGH (ref 70–99)
Glucose-Capillary: 197 mg/dL — ABNORMAL HIGH (ref 70–99)
Glucose-Capillary: 159 mg/dL — ABNORMAL HIGH (ref 70–99)
Glucose-Capillary: 188 mg/dL — ABNORMAL HIGH (ref 70–99)
Glucose-Capillary: 171 mg/dL — ABNORMAL HIGH (ref 70–99)

## 2024-12-09 LAB — TYPE AND SCREEN
ABO/RH(D): B POS
Antibody Screen: NEGATIVE

## 2024-12-09 LAB — LACTIC ACID, PLASMA: Lactic Acid, Venous: 2.4 mmol/L (ref 0.5–1.9)

## 2024-12-09 SURGERY — LAPAROSCOPIC CHOLECYSTECTOMY WITH INTRAOPERATIVE CHOLANGIOGRAM
Anesthesia: General | Site: Abdomen

## 2024-12-09 MED ORDER — INDOCYANINE GREEN 25 MG IJ SOLR
1.2500 mg | INTRAMUSCULAR | Status: AC
  Administered 2024-12-09: 13:00:00 1.25 mg via INTRAVENOUS

## 2024-12-09 MED ORDER — 0.9 % SODIUM CHLORIDE (POUR BTL) OPTIME
TOPICAL | Status: DC | PRN
  Administered 2024-12-09: 15:00:00 300 mL

## 2024-12-09 MED ORDER — LACTATED RINGERS IV SOLN: INTRAVENOUS | Status: DC

## 2024-12-09 MED ORDER — PROPOFOL 10 MG/ML IV BOLUS
INTRAVENOUS | Status: DC | PRN
  Administered 2024-12-09: 14:00:00 125 ug/kg/min via INTRAVENOUS

## 2024-12-09 MED ORDER — PROPOFOL 1000 MG/100ML IV EMUL
INTRAVENOUS | Status: AC
  Filled 2024-12-09: qty 100

## 2024-12-09 MED ORDER — OXYCODONE HCL 5 MG PO TABS: 5.0000 mg | ORAL_TABLET | Freq: Once | ORAL | Status: DC | PRN

## 2024-12-09 MED ORDER — BUPIVACAINE-EPINEPHRINE (PF) 0.25% -1:200000 IJ SOLN
INTRAMUSCULAR | Status: AC
  Filled 2024-12-09: qty 30

## 2024-12-09 MED ORDER — PHENYLEPHRINE 80 MCG/ML (10ML) SYRINGE FOR IV PUSH (FOR BLOOD PRESSURE SUPPORT)
PREFILLED_SYRINGE | INTRAVENOUS | Status: DC | PRN
  Administered 2024-12-09: 14:00:00 160 ug via INTRAVENOUS

## 2024-12-09 MED ORDER — ROCURONIUM BROMIDE 100 MG/10ML IV SOLN
INTRAVENOUS | Status: DC | PRN
  Administered 2024-12-09: 14:00:00 60 mg via INTRAVENOUS

## 2024-12-09 MED ORDER — SUGAMMADEX SODIUM 200 MG/2ML IV SOLN
INTRAVENOUS | Status: DC | PRN
  Administered 2024-12-09: 15:00:00 200 mg via INTRAVENOUS

## 2024-12-09 MED ORDER — FENTANYL CITRATE (PF) 100 MCG/2ML IJ SOLN: 25.0000 ug | INTRAMUSCULAR | Status: DC | PRN

## 2024-12-09 MED ORDER — CEFAZOLIN SODIUM-DEXTROSE 1-4 GM/50ML-% IV SOLN
INTRAVENOUS | Status: DC | PRN
  Administered 2024-12-09: 14:00:00 2 g via INTRAVENOUS

## 2024-12-09 MED ORDER — LIDOCAINE HCL (CARDIAC) PF 100 MG/5ML IV SOSY
PREFILLED_SYRINGE | INTRAVENOUS | Status: DC | PRN
  Administered 2024-12-09: 14:00:00 100 mg via INTRAVENOUS

## 2024-12-09 MED ORDER — DM-GUAIFENESIN ER 30-600 MG PO TB12: 1.0000 | ORAL_TABLET | Freq: Two times a day (BID) | ORAL | Status: DC | PRN

## 2024-12-09 MED ORDER — IOPAMIDOL (ISOVUE-300) INJECTION 61%
INTRAVENOUS | Status: DC | PRN
  Administered 2024-12-09: 15:00:00 15 mL

## 2024-12-09 MED ORDER — ACETAMINOPHEN 500 MG PO TABS
1000.0000 mg | ORAL_TABLET | Freq: Four times a day (QID) | ORAL | Status: DC
  Administered 2024-12-09 – 2024-12-10 (×3): 1000 mg via ORAL
  Filled 2024-12-09 (×3): qty 2

## 2024-12-09 MED ORDER — FENTANYL CITRATE (PF) 100 MCG/2ML IJ SOLN
INTRAMUSCULAR | Status: AC
  Filled 2024-12-09: qty 2

## 2024-12-09 MED ORDER — PHENYLEPHRINE HCL-NACL 20-0.9 MG/250ML-% IV SOLN
INTRAVENOUS | Status: AC
  Filled 2024-12-09: qty 250

## 2024-12-09 MED ORDER — PROPOFOL 10 MG/ML IV BOLUS
INTRAVENOUS | Status: AC
  Filled 2024-12-09: qty 20

## 2024-12-09 MED ORDER — FENTANYL CITRATE (PF) 100 MCG/2ML IJ SOLN
INTRAMUSCULAR | Status: DC | PRN
  Administered 2024-12-09 (×2): 50 ug via INTRAVENOUS

## 2024-12-09 MED ORDER — ALBUTEROL SULFATE (2.5 MG/3ML) 0.083% IN NEBU: 2.5000 mg | INHALATION_SOLUTION | RESPIRATORY_TRACT | Status: DC | PRN

## 2024-12-09 MED ORDER — INDOCYANINE GREEN 25 MG IJ SOLR
INTRAMUSCULAR | Status: AC
  Filled 2024-12-09: qty 10

## 2024-12-09 MED ORDER — BUPIVACAINE-EPINEPHRINE 0.25% -1:200000 IJ SOLN
INTRAMUSCULAR | Status: DC | PRN
  Administered 2024-12-09: 15:00:00 30 mL

## 2024-12-09 MED ORDER — OXYCODONE HCL 5 MG/5ML PO SOLN: 5.0000 mg | Freq: Once | ORAL | Status: DC | PRN

## 2024-12-09 MED ORDER — ONDANSETRON HCL 4 MG/2ML IJ SOLN
INTRAMUSCULAR | Status: DC | PRN
  Administered 2024-12-09: 15:00:00 4 mg via INTRAVENOUS

## 2024-12-09 MED ORDER — ONDANSETRON HCL 4 MG/2ML IJ SOLN
INTRAMUSCULAR | Status: AC
  Filled 2024-12-09: qty 2

## 2024-12-09 MED ORDER — SODIUM CHLORIDE (PF) 0.9 % IJ SOLN
INTRAMUSCULAR | Status: AC
  Filled 2024-12-09: qty 50

## 2024-12-09 MED ORDER — SODIUM CHLORIDE 0.9 % IV SOLN
INTRAVENOUS | Status: AC | PRN
  Administered 2024-12-09: 15:00:00 15 mL via INTRAMUSCULAR

## 2024-12-09 MED ORDER — PHENYLEPHRINE HCL-NACL 20-0.9 MG/250ML-% IV SOLN
INTRAVENOUS | Status: DC | PRN
  Administered 2024-12-09: 14:00:00 30 ug/min via INTRAVENOUS

## 2024-12-09 SURGICAL SUPPLY — 37 items
APPLICATOR COTTON TIP 6 STRL (MISCELLANEOUS) ×2 IMPLANT
CLIP APPLIE 5 13 M/L LIGAMAX5 (MISCELLANEOUS) ×2 IMPLANT
DEFOGGER SCOPE WARM SEASHARP (MISCELLANEOUS) ×2 IMPLANT
DERMABOND ADVANCED .7 DNX12 (GAUZE/BANDAGES/DRESSINGS) ×2 IMPLANT
DRAIN CHANNEL 19F RND (DRAIN) IMPLANT
DRAPE C-ARM XRAY 36X54 (DRAPES) ×4 IMPLANT
DRSG TEGADERM 4X4.75 (GAUZE/BANDAGES/DRESSINGS) IMPLANT
ELECTRODE CAUTERY BLDE TIP 2.5 (TIP) ×2 IMPLANT
ELECTRODE REM PT RTRN 9FT ADLT (ELECTROSURGICAL) ×2 IMPLANT
EVACUATOR SILICONE 100CC (DRAIN) IMPLANT
GLOVE BIO SURGEON STRL SZ7 (GLOVE) ×2 IMPLANT
GOWN STRL REUS W/ TWL LRG LVL3 (GOWN DISPOSABLE) ×6 IMPLANT
IRRIGATION STRYKERFLOW (MISCELLANEOUS) ×2 IMPLANT
IV 0.9% NACL 1000 ML (IV SOLUTION) ×2 IMPLANT
IV CATH ANGIO 12GX3 LT BLUE (NEEDLE) IMPLANT
LHOOK LAP DISP 36CM (ELECTROSURGICAL) ×2 IMPLANT
MANIFOLD NEPTUNE II (INSTRUMENTS) ×2 IMPLANT
NDL HYPO 22X1.5 SAFETY MO (MISCELLANEOUS) ×2 IMPLANT
NEEDLE HYPO 22X1.5 SAFETY MO (MISCELLANEOUS) ×2 IMPLANT
NS IRRIG 500ML POUR BTL (IV SOLUTION) ×2 IMPLANT
PACK LAP CHOLECYSTECTOMY (MISCELLANEOUS) ×2 IMPLANT
SCISSORS LAP 5X35 DISP (ENDOMECHANICALS) ×2 IMPLANT
SET TUBE SMOKE EVAC HIGH FLOW (TUBING) ×2 IMPLANT
SLEEVE Z-THREAD 5X100MM (TROCAR) ×4 IMPLANT
SPONGE DRAIN TRACH 4X4 STRL 2S (GAUZE/BANDAGES/DRESSINGS) IMPLANT
SPONGE T-LAP 18X18 ~~LOC~~+RFID (SPONGE) ×2 IMPLANT
STOPCOCK 4 WAY LG BORE MALE ST (IV SETS) IMPLANT
SUT ETHIBOND 0 MO6 C/R (SUTURE) IMPLANT
SUT MNCRL AB 4-0 PS2 18 (SUTURE) ×2 IMPLANT
SUT VICRYL 0 UR6 27IN ABS (SUTURE) ×4 IMPLANT
SUTURE EHLN 3-0 FS-10 30 BLK (SUTURE) IMPLANT
SYR 20ML LL LF (SYRINGE) ×2 IMPLANT
SYR 30ML LL (SYRINGE) IMPLANT
SYSTEM BAG RETRIEVAL 10MM (BASKET) ×2 IMPLANT
TRAP FLUID SMOKE EVACUATOR (MISCELLANEOUS) ×2 IMPLANT
TROCAR BALLN 12MMX100 BLUNT (TROCAR) ×2 IMPLANT
TROCAR Z-THREAD FIOS 5X100MM (TROCAR) ×2 IMPLANT

## 2024-12-09 NOTE — Anesthesia Procedure Notes (Signed)
 Procedure Name: Intubation Date/Time: 12/09/2024 1:47 PM  Performed by: Jestine Palma, CRNAPre-anesthesia Checklist: Patient identified, Emergency Drugs available, Suction available and Patient being monitored Patient Re-evaluated:Patient Re-evaluated prior to induction Oxygen Delivery Method: Circle system utilized Preoxygenation: Pre-oxygenation with 100% oxygen Induction Type: IV induction Ventilation: Mask ventilation without difficulty Laryngoscope Size: McGrath and 3 Grade View: Grade I Tube type: Oral Tube size: 7.5 mm Number of attempts: 1 Airway Equipment and Method: Stylet and Oral airway Placement Confirmation: ETT inserted through vocal cords under direct vision, positive ETCO2 and breath sounds checked- equal and bilateral Secured at: 21 cm Tube secured with: Tape Dental Injury: Teeth and Oropharynx as per pre-operative assessment

## 2024-12-09 NOTE — TOC CM/SW Note (Signed)
 Transition of Care [facility]) - Inpatient Brief Assessment   Patient Details  Name: Randy Lopez MRN: [id number] Date of Birth: May 13, 1931  Transition of Care [facility]) CM/SW Contact:    Corean ONEIDA Haddock, RN Phone Number: 12/09/2024, 10:27 AM   Clinical Narrative:   Transition of Care (TOC) Screening Note   Patient Details  Name: Randy Lopez Date of Birth: [birth year]   Transition of Care [facility]) CM/SW Contact:    Corean ONEIDA Haddock, RN Phone Number: 12/09/2024, 10:27 AM    Transition of Care Department [facility]) has reviewed patient and no TOC needs have been identified at this time. We will continue to monitor patient advancement through interdisciplinary progression rounds. If new patient transition needs arise, please place a TOC consult.    Transition of Care Asessment: Insurance and Status: Insurance coverage has been reviewed Patient has primary care physician: Yes     Prior/Current Home Services: No current home services Social Drivers of Health Review: SDOH reviewed no interventions necessary Readmission risk has been reviewed: Yes Transition of care needs: no transition of care needs at this time

## 2024-12-09 NOTE — Progress Notes (Signed)
 Consent form signed by patient and placed in binder

## 2024-12-09 NOTE — Consult Note (Addendum)
 [facility] SURGICAL CONSULTATION NOTE (initial) - cpt: 99244   HISTORY OF PRESENT ILLNESS (HPI):  89 y.o. male presented to [facility] ED overnight for weakness. Patient reports the night prior to presentation, he started to feel poorly with multiple episodes of emesis and nausea. He did note some right sided abdominal pain as well. No fever, chills, cough, CP, SOB, diarrhea. Of note, he has a history of hypertension, hyperlipidemia, diabetes, rheumatoid arthritis, HFrEF with an EF of 45%, atrial fibrillation only on ASA, COPD, pulmonary fibrosis, and need for pacemaker ([employer]). No previous abdominal surgeries. Patient and his wife live independently. He is able to perform 4 METS without CP/SOB. Work up in the ED revealed a leukocytosis to 14.8K (now 18.2K), Hgb to 12.6, sCr at baseline to 1.43, hyperbilirubinemia to 1.7 (1.4 this AM), and initial venous lactate to 2.5 (most recent 2.4). He did get RUQ US  with cholelithiasis and wall thickening concerning for cholecystitis. He was admitted to medicine service. He is on Zosyn .   Surgery is consulted by emergency medicine physician Dr. Clotilda Punter, MD in this context for evaluation and management of acute cholecystitis.  PAST MEDICAL HISTORY (PMH):  Past Medical History:  Diagnosis Date   Acute respiratory failure (HCC)    Anemia, unspecified    Arrhythmia    A-Fib   BPH (benign prostatic hyperplasia)    Chronic kidney disease    Coronary atherosclerosis of native coronary artery    Dupuytren's contracture    ED (erectile dysfunction)    Essential hypertension, benign    Other and unspecified hyperlipidemia    Rheumatoid arthritis(714.0)    Sleep apnea    Trigger finger (acquired)    Type II or unspecified type diabetes mellitus without mention of complication, not stated as uncontrolled      PAST SURGICAL HISTORY (PSH):  Past Surgical History:  Procedure Laterality Date   ESOPHAGOGASTRODUODENOSCOPY (EGD) WITH PROPOFOL   N/A 05/30/2016   Procedure: ESOPHAGOGASTRODUODENOSCOPY (EGD) WITH PROPOFOL ;  Surgeon: [name], MD;  Location: [facility] ENDOSCOPY;  Service: Endoscopy;  Laterality: N/A;   INSERT / REPLACE / REMOVE PACEMAKER     PPM GENERATOR CHANGEOUT N/A 04/02/2024   Procedure: PPM GENERATOR CHANGEOUT;  Surgeon: [name], MD;  Location: [facility] INVASIVE CV LAB;  Service: Cardiovascular;  Laterality: N/A;     MEDICATIONS:  Prior to Admission medications  Medication Sig Start Date End Date Taking? Authorizing Provider  aspirin 81 MG tablet Take 81 mg by mouth daily.   Yes [provider]  betamethasone  dipropionate 0.05 % cream Apply topically 2 (two) times daily. To the foreskin 10/08/24  Yes McGowan, Clotilda A, PA-C  carboxymethylcellulose (REFRESH PLUS) 0.5 % SOLN Place 1 drop into both eyes daily as needed (dry eyes).   Yes [provider]  Cholecalciferol (VITAMIN D3) 1000 UNITS CAPS Take 1,000 Units by mouth daily.   Yes [provider]  diclofenac  Sodium (VOLTAREN ) 1 % GEL Apply 2 grams topically to the affected areas 3 (three) times daily. 07/15/24  Yes   empagliflozin  (JARDIANCE ) 10 MG TABS tablet Take 1 tablet (10 mg total) by mouth daily. 05/19/24  Yes   folic acid  (FOLVITE ) 1 MG tablet Take 1 tablet (1 mg total) by mouth daily. 07/15/24  Yes   methotrexate  (RHEUMATREX) 2.5 MG tablet Take 6 tablets (15 mg total) by mouth every 7 days. 07/15/24  Yes   nystatin  cream (MYCOSTATIN ) Apply 1 Application topically 2 (two) times daily. Inside the foreskin with a Q-tip 10/08/24  Yes McGowan, Shannon A, PA-C  pantoprazole  (PROTONIX ) 40 MG tablet Take 1 tablet (40 mg total) by mouth daily. 05/12/24  Yes   pioglitazone  (ACTOS ) 15 MG tablet Take 1 tablet (15 mg total) by mouth daily. 11/29/24  Yes   triamcinolone ointment (KENALOG) 0.1 % Apply 1 Application topically 2 (two) times daily. 08/20/24  Yes [provider]  glucose blood (TRUE METRIX BLOOD GLUCOSE TEST) test strip  use as directed to test daily 12/09/23     TRUE METRIX BLOOD GLUCOSE TEST test strip SMARTSIG:Via Meter 10/30/22   [provider]  TRUEplus Lancets 33G MISC  10/02/21   [provider]  TRUEplus Lancets 33G MISC use as directed to test blood sugar daily 06/21/23        ALLERGIES:  Allergies[1]   SOCIAL HISTORY:  Social History   Socioeconomic History   Marital status: Married    Spouse name: Not on file   Number of children: Not on file   Years of education: Not on file   Highest education level: Not on file  Occupational History   Not on file  Tobacco Use   Smoking status: Former    Current packs/day: 0.00    Average packs/day: 1 pack/day for 30.0 years (30.0 ttl pk-yrs)    Types: Cigarettes    Start date: 02/04/1951    Quit date: 02/04/1981    Years since quitting: 43.8    Passive exposure: Past   Smokeless tobacco: Former  Substance and Sexual Activity   Alcohol  use: No   Drug use: No   Sexual activity: Not Currently  Other Topics Concern   Not on file  Social History Narrative   Not on file   Social Drivers of Health   Tobacco Use: Medium Risk (12/08/2024)   Patient History    Smoking Tobacco Use: Former    Smokeless Tobacco Use: Former    Passive Exposure: Past  Physicist, Medical Strain: Low Risk  (08/04/2024)   Received from [facility]   Overall Financial Resource Strain (CARDIA)    Difficulty of Paying Living Expenses: Not hard at all  Food Insecurity: No Food Insecurity (12/09/2024)   Epic    Worried About Radiation Protection Practitioner of Food in the Last Year: Never true    Ran Out of Food in the Last Year: Never true  Transportation Needs: No Transportation Needs (12/09/2024)   Epic    Lack of Transportation (Medical): No    Lack of Transportation (Non-Medical): No  Physical Activity: Not on file  Stress: Not on file  Social Connections: Not on file  Intimate Partner Violence: Not At Risk (12/09/2024)   Epic    Fear of Current  or Ex-Partner: No    Emotionally Abused: No    Physically Abused: No    Sexually Abused: No  Depression (PHQ2-9): Not on file  Alcohol  Screen: Not on file  Housing: Low Risk (12/09/2024)   Epic    Unable to Pay for Housing in the Last Year: No    Number of Times Moved in the Last Year: 0    Homeless in the Last Year: No  Utilities: Not At Risk (12/09/2024)   Epic    Threatened with loss of utilities: No  Health Literacy: Not on file     FAMILY HISTORY:  Family History  Problem Relation Age of Onset   Diabetes Mother    Heart Problems Father       REVIEW OF SYSTEMS:  Review of  Systems  Constitutional:  Negative for chills and fever.  Respiratory:  Negative for cough and shortness of breath.   Cardiovascular:  Negative for chest pain and palpitations.  Gastrointestinal:  Positive for abdominal pain, nausea and vomiting. Negative for constipation and diarrhea.  Genitourinary:  Negative for dysuria and urgency.  All other systems reviewed and are negative.   VITAL SIGNS:  Temp:  [97.7 F (36.5 C)-99.1 F (37.3 C)] 99.1 F (37.3 C) (12/17 0802) Pulse Rate:  [79-93] 79 (12/17 0802) Resp:  [11-20] 17 (12/17 0802) BP: (137-162)/(64-80) 137/67 (12/17 0802) SpO2:  [96 %-100 %] 97 % (12/17 0802) Weight:  [96 kg-96.7 kg] 96 kg (12/16 2037)     Height: 6' (182.9 cm) Weight: 96 kg BMI (Calculated): 28.7   INTAKE/OUTPUT:  12/16 0701 - 12/17 0700 In: 795.8 [I.V.:645.8; IV Piggyback:150] Out: 200 [Urine:200]  PHYSICAL EXAM:  Physical Exam Vitals and nursing note reviewed. Exam conducted with a chaperone present.  Constitutional:      General: He is not in acute distress.    Appearance: Normal appearance. He is normal weight. He is not ill-appearing.     Comments: Resting in bed; NAD  HENT:     Head: Normocephalic and atraumatic.  Eyes:     General: No scleral icterus.    Conjunctiva/sclera: Conjunctivae normal.  Cardiovascular:     Rate and Rhythm: Normal rate.      Pulses: Normal pulses.     Heart sounds: No murmur heard. Pulmonary:     Effort: Pulmonary effort is normal. No respiratory distress.  Abdominal:     General: Abdomen is flat. There is no distension.     Palpations: Abdomen is soft.     Tenderness: There is abdominal tenderness in the right upper quadrant. There is no guarding or rebound.     Comments: Abdomen is soft, he is tender in RUQ although improving, non-distended  Genitourinary:    Comments: Deferred Musculoskeletal:     Right lower leg: No edema.     Left lower leg: No edema.  Skin:    General: Skin is warm and dry.     Coloration: Skin is not jaundiced.  Neurological:     General: No focal deficit present.     Mental Status: He is alert and oriented to person, place, and time.  Psychiatric:        Mood and Affect: Mood normal.        Behavior: Behavior normal.      Labs:     Latest Ref Rng & Units 12/09/2024    2:07 AM 12/08/2024    4:45 PM 06/09/2014    5:44 AM  CBC  WBC 4.0 - 10.5 K/uL 18.2  14.8  4.9   Hemoglobin 13.0 - 17.0 g/dL 87.5  87.3  88.3   Hematocrit 39.0 - 52.0 % 36.2  38.2  33.2   Platelets 150 - 400 K/uL 172  180  168       Latest Ref Rng & Units 12/09/2024    2:07 AM 12/08/2024    4:45 PM 06/09/2014    5:44 AM  CMP  Glucose 70 - 99 mg/dL 848  789  879   BUN 8 - 23 mg/dL 27  28  24    Creatinine 0.61 - 1.24 mg/dL 8.57  8.56  8.62   Sodium 135 - 145 mmol/L 138  139  135   Potassium 3.5 - 5.1 mmol/L 3.9  3.9  4.7   Chloride 98 -  111 mmol/L 101  101  105   CO2 22 - 32 mmol/L 25  25  22    Calcium 8.9 - 10.3 mg/dL 9.2  9.6  8.1   Total Protein 6.5 - 8.1 g/dL 6.9  7.5  6.8   Total Bilirubin 0.0 - 1.2 mg/dL 1.5  1.7  0.5   Alkaline Phos 38 - 126 U/L 101  100  72   AST 15 - 41 U/L 114  133  46   ALT 0 - 44 U/L 99  79  77      Imaging studies:   CT Abdomen/Pelvis (12/08/2024) personally reviewed with gallbladder wall thickening and cholelithiasis concerning for cholecystitis, and  radiologist report reviewed below: IMPRESSION: 1. Cholelithiasis with multiple stones and sludge in the gallbladder, including a stone in the gallbladder neck. 2. Gallbladder wall thickening measuring up to 9 mm. 3. Findings may represent acute cholecystitis in the appropriate clinical setting.   Assessment/Plan: (ICD-10's: K81.0) 89 y.o. male with likely acute cholecystitis and improving hyperbilirubinemia, complicated by multiple comorbidities including atrial fibrillation not on anticoagulation, CHF with EF 45%, HTN, HLD, DM.   - Appreciate medicine admission - Unfortunately, can not have MRCP given non-compatible pacemaker    - Although he is 72, he is quite functional and independent and not on anticoagulation, I do think it is reasonable to offer cholecystectomy in this setting understanding the risks, which he, and his wife, are in agreement with this. We will plan for robotic assisted laparoscopic cholecystectomy with IOC given hyperbilirubinemia and inability to get MRCP this afternoon with Dr Jordis.    - All risks, benefits, and alternatives to above procedure(s) were discussed with the patient and his family, all of their questions were answered to their expressed satisfaction, patient expresses he wishes to proceed, and informed consent was obtained.    - NPO for planned procedure; IVF support  - IV Abx (Zosyn )  - Monitor abdominal examination; on-going bowel function  - Pain control prn; antiemetics prn  - Monitor leukocytosis; worsening  - Monitor lactic acidosis  - Monitor hyperbilirubinemia  All of the above findings and recommendations were discussed with the patient and his family, and all of their questions were answered to their expressed satisfaction.  Thank you for the opportunity to participate in this patient's care.   -- Arthea Platt, PA-C [facility] 12/09/2024, 8:17 AM M-F: 7am - 4pm     [1]  Allergies Allergen Reactions    Atorvastatin     Other reaction(s): Muscle Pain   Lipitor [Atorvastatin Calcium]    Tamsulosin     Other reaction(s): Other (See Comments) Nasal congestion

## 2024-12-09 NOTE — Op Note (Signed)
 Laparoscopic Cholecystectomy Repair umbilical hernia  Pre-operative Diagnosis: Acute cholecystis  Post-operative Diagnosis: same  Procedure: Laparoscopic cholecystectomy with IOC  Surgeon: Laneta Luna, MD FACS  Anesthesia: Gen. with endotracheal tube  Findings: Severe acute  Cholecystitis  with hydrops  NO evidence of bile injuries  Contrast w good antegrade and retrograde flow into hepatic radical and duodenum  Estimated Blood Loss: 75cc         Drains: 198 FR         Specimens: Gallbladder           Complications: none   Procedure Details  The patient was seen again in the Holding Room. The benefits, complications, treatment options, and expected outcomes were discussed with the patient. The risks of bleeding, infection, recurrence of symptoms, failure to resolve symptoms, bile duct damage, bile duct leak, retained common bile duct stone, bowel injury, any of which could require further surgery and/or ERCP, stent, or papillotomy were reviewed with the patient. The likelihood of improving the patient's symptoms with return to their baseline status is good.  The patient and/or family concurred with the proposed plan, giving informed consent.  The patient was taken to Operating Room, identified as Randy Lopez and the procedure verified as Laparoscopic Cholecystectomy.  A Time Out was held and the above information confirmed.  Prior to the induction of general anesthesia, antibiotic prophylaxis was administered. VTE prophylaxis was in place. General endotracheal anesthesia was then administered and tolerated well. After the induction, the abdomen was prepped with Chloraprep and draped in the sterile fashion. The patient was positioned in the supine position.  Cut down technique was used to enter the abdominal cavity and a hernia defect visualized and dissected free, Hasson trochar was placed via the defect after two vicryl stitches were anchored to the fascia. Pneumoperitoneum was  then created with CO2 and tolerated well without any adverse changes in the patient's vital signs.  Three 5-mm ports were placed in the right upper quadrant all under direct vision. All skin incisions  were infiltrated with a local anesthetic agent before making the incision and placing the trocars.   The patient was positioned  in reverse Trendelenburg, tilted slightly to the patient's left.  The gallbladder was identified, the fundus grasped and retracted cephalad. Dense Adhesions were lysed with cautery, omentum stuck to the GB, severe inflammatory response with distended GB, I needed to evacuate GB to allow mobilization, this was done with cautery and hydrops suctioned.  There was significant inflammatory response in the GB and liver with friable liver, this made the operation difficult and requiring significant time more than usually needed. Meticulous dissection done to restore the anatomy. The infundibulum was grasped and retracted laterally, exposing the peritoneum overlying the triangle of Calot.  This was then divided and exposed in a blunt fashion. An extended critical view of the cystic duct and cystic artery was obtained.    The cystic duct was clearly identified and bluntly dissected.   Cystic duct clip and divided, two stones ocllusing the duct were milked and removed. Cholangiogram catheter placed and cholangiogram performed by instilling contrast.  NO evidence of bile injuries  Contrast w good antegrade and retrograde flow into hepatic radical and duodenum, no filling defects. Catheter removed.  Artery and cystic  duct were double clipped and divided.  The gallbladder was taken from the gallbladder fossa in a retrograde fashion with the electrocautery. The gallbladder was removed and placed in an Endocatch bag. The liver bed was irrigated and  inspected. Friable liver requiring extra hemostasis with cauteryHemostasis was achieved with the electrocautery. Copious irrigation was  utilized and was repeatedly aspirated until clear.  The gallbladder and Endocatch sac were then removed through a port site.  THere were multiple stones floating in the abdominal cavity, I had to use another bag to place additional stones to remove them from the abdominal cavity, this also added significant time to the operation Inspection of the right upper quadrant was performed. No bleeding, bile duct injury or leak, or bowel injury was noted. Pneumoperitoneum was released.  The periumbilical port site was closed with interrumpted 0 Vicryl sutures, incorporating ventral defect. 4-0 subcuticular Monocryl was used to close the skin. Dermabond was  applied.  The patient was then extubated and brought to the recovery room in stable condition. Sponge, lap, and needle counts were correct at closure and at the conclusion of the case.               Laneta Luna, MD, FACS

## 2024-12-09 NOTE — Consult Note (Cosign Needed)
 [facility] CLINIC CARDIOLOGY CONSULT NOTE       Patient ID: Randy Lopez MRN: [id number] DOB/AGE: 09/02/1931 89 y.o.  Admit date: 12/08/2024 Referring Physician Dr. Derryl Duval Primary Physician [name], MD  Primary Cardiologist Dr. Ammon Reason for Consultation POC  HPI: SHADRACK Lopez is a 89 y.o. 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  Found to have acute cholecystitis.  Given his history and need for surgery, cardiology was consulted for further evaluation.   Patient presented to the ED with complaints of abdominal pain and vomiting which began yesterday.  Workup in the ED notable for creatinine 1.43, potassium 3.9, hemoglobin 12.6, WBC 14.8. Troponins 52 > 54 > 69 > 75 > 77, BNP 2093.  Lactic acid has trended 2.5 > 3.8 > 2.3 > 2.4.  EKG in the ED ventricular pacing rate 90 bpm.  Right upper quadrant ultrasound with suspected acute cholecystitis.  At the time my evaluation this morning, patient is resting comfortably in hospital bed with wife at bedside.  We discussed his symptoms in further detail.  He states that overall from a cardiac perspective he has been doing quite well recently.  Denies any episodes of chest pain, shortness of breath, palpitations, lower extremity edema.  States that he has had no functional decline over the last 6 months and has been tolerating his medications well with no problems.  Review of systems complete and found to be negative unless listed above    Past Medical History:  Diagnosis Date   Acute respiratory failure (HCC)    Anemia, unspecified    Arrhythmia    A-Fib   BPH (benign prostatic hyperplasia)    Chronic kidney disease    Coronary atherosclerosis of native coronary  artery    Dupuytren's contracture    ED (erectile dysfunction)    Essential hypertension, benign    Other and unspecified hyperlipidemia    Rheumatoid arthritis(714.0)    Sleep apnea    Trigger finger (acquired)    Type II or unspecified type diabetes mellitus without mention of complication, not stated as uncontrolled     Past Surgical History:  Procedure Laterality Date   ESOPHAGOGASTRODUODENOSCOPY (EGD) WITH PROPOFOL  N/A 05/30/2016   Procedure: ESOPHAGOGASTRODUODENOSCOPY (EGD) WITH PROPOFOL ;  Surgeon: [name], MD;  Location: [facility] ENDOSCOPY;  Service: Endoscopy;  Laterality: N/A;   INSERT / REPLACE / REMOVE PACEMAKER     PPM GENERATOR CHANGEOUT N/A 04/02/2024   Procedure: PPM GENERATOR CHANGEOUT;  Surgeon: [name], MD;  Location: [facility] INVASIVE CV LAB;  Service: Cardiovascular;  Laterality: N/A;    Medications Prior to Admission  Medication Sig Dispense Refill Last Dose/Taking   aspirin 81 MG tablet Take 81 mg by mouth daily.   Past Week   betamethasone  dipropionate 0.05 % cream Apply topically 2 (two) times daily. To the foreskin 30 g 0 Past Week   carboxymethylcellulose (REFRESH PLUS) 0.5 % SOLN Place 1 drop into both eyes daily as needed (dry eyes).   Unknown   Cholecalciferol (VITAMIN D3) 1000 UNITS CAPS Take 1,000 Units by mouth daily.   Past Week   diclofenac  Sodium (VOLTAREN ) 1 % GEL Apply 2 grams topically to the affected areas 3 (three) times daily. 100 g 11 Past Week   empagliflozin  (  JARDIANCE ) 10 MG TABS tablet Take 1 tablet (10 mg total) by mouth daily. 90 tablet 3 Past Week   folic acid  (FOLVITE ) 1 MG tablet Take 1 tablet (1 mg total) by mouth daily. 90 tablet 3 Past Week   methotrexate  (RHEUMATREX) 2.5 MG tablet Take 6 tablets (15 mg total) by mouth every 7 days. 72 tablet 1 12/01/2024   nystatin  cream (MYCOSTATIN ) Apply 1 Application topically 2 (two) times daily. Inside the foreskin with a Q-tip 30 g 0 Past Week   pantoprazole  (PROTONIX ) 40 MG tablet  Take 1 tablet (40 mg total) by mouth daily. 90 tablet 1 Past Week   pioglitazone  (ACTOS ) 15 MG tablet Take 1 tablet (15 mg total) by mouth daily. 90 tablet 3 Past Week   triamcinolone ointment (KENALOG) 0.1 % Apply 1 Application topically 2 (two) times daily.   Unknown   glucose blood (TRUE METRIX BLOOD GLUCOSE TEST) test strip use as directed to test daily 100 each 3    TRUE METRIX BLOOD GLUCOSE TEST test strip SMARTSIG:Via Meter      TRUEplus Lancets 33G MISC       TRUEplus Lancets 33G MISC use as directed to test blood sugar daily 100 each 1    Social History   Socioeconomic History   Marital status: Married    Spouse name: Not on file   Number of children: Not on file   Years of education: Not on file   Highest education level: Not on file  Occupational History   Not on file  Tobacco Use   Smoking status: Former    Current packs/day: 0.00    Average packs/day: 1 pack/day for 30.0 years (30.0 ttl pk-yrs)    Types: Cigarettes    Start date: 02/04/1951    Quit date: 02/04/1981    Years since quitting: 43.8    Passive exposure: Past   Smokeless tobacco: Former  Substance and Sexual Activity   Alcohol  use: No   Drug use: No   Sexual activity: Not Currently  Other Topics Concern   Not on file  Social History Narrative   Not on file   Social Drivers of Health   Tobacco Use: Medium Risk (12/08/2024)   Patient History    Smoking Tobacco Use: Former    Smokeless Tobacco Use: Former    Passive Exposure: Past  Physicist, Medical Strain: Low Risk  (08/04/2024)   Received from [employer] [facility]   Overall Financial Resource Strain (CARDIA)    Difficulty of Paying Living Expenses: Not hard at all  Food Insecurity: No Food Insecurity (12/09/2024)   Epic    Worried About Radiation Protection Practitioner of Food in the Last Year: Never true    Ran Out of Food in the Last Year: Never true  Transportation Needs: No Transportation Needs (12/09/2024)   Epic    Lack of Transportation (Medical):  No    Lack of Transportation (Non-Medical): No  Physical Activity: Not on file  Stress: Not on file  Social Connections: Not on file  Intimate Partner Violence: Not At Risk (12/09/2024)   Epic    Fear of Current or Ex-Partner: No    Emotionally Abused: No    Physically Abused: No    Sexually Abused: No  Depression (PHQ2-9): Not on file  Alcohol  Screen: Not on file  Housing: Low Risk (12/09/2024)   Epic    Unable to Pay for Housing in the Last Year: No    Number of Times Moved in the Last  Year: 0    Homeless in the Last Year: No  Utilities: Not At Risk (12/09/2024)   Epic    Threatened with loss of utilities: No  Health Literacy: Not on file    Family History  Problem Relation Age of Onset   Diabetes Mother    Heart Problems Father      Vitals:   12/08/24 1954 12/08/24 2037 12/09/24 0428 12/09/24 0802  BP: (!) 151/70 (!) 150/74 (!) 143/64 137/67  Pulse: 87 87 83 79  Resp: 16 18 20 17   Temp: 97.8 F (36.6 C) 97.7 F (36.5 C) 98.5 F (36.9 C) 99.1 F (37.3 C)  TempSrc: Oral   Oral  SpO2: 99% 99% 96% 97%  Weight:  96 kg    Height:  6' (1.829 m)      PHYSICAL EXAM General: Well-appearing elderly male, well nourished, in no acute distress. HEENT: Normocephalic and atraumatic. Neck: No JVD.  Lungs: Normal respiratory effort on room air. Clear bilaterally to auscultation. No wheezes, crackles, rhonchi.  Heart: HRRR. Normal S1 and S2 without gallops or murmurs.  Abdomen: Non-distended appearing.  Msk: Normal strength and tone for age. Extremities: Warm and well perfused. No clubbing, cyanosis.  No edema.  Neuro: Alert and oriented X 3. Psych: Answers questions appropriately.   Labs: Basic Metabolic Panel: Recent Labs    12/08/24 1645 12/09/24 0207  NA 139 138  K 3.9 3.9  CL 101 101  CO2 25 25  GLUCOSE 210* 151*  BUN 28* 27*  CREATININE 1.43* 1.42*  CALCIUM 9.6 9.2  MG 2.0  --    Liver Function Tests: Recent Labs    12/08/24 1645 12/09/24 0207  AST  133* 114*  ALT 79* 99*  ALKPHOS 100 101  BILITOT 1.7* 1.5*  PROT 7.5 6.9  ALBUMIN 4.0 3.7   Recent Labs    12/08/24 1645  LIPASE 36   CBC: Recent Labs    12/08/24 1645 12/09/24 0207  WBC 14.8* 18.2*  NEUTROABS 13.3*  --   HGB 12.6* 12.4*  HCT 38.2* 36.2*  MCV 91.6 89.8  PLT 180 172   Cardiac Enzymes: No results for input(s): CKTOTAL, CKMB, CKMBINDEX, TROPONINIHS in the last 72 hours. BNP: No results for input(s): BNP in the last 72 hours. D-Dimer: No results for input(s): DDIMER in the last 72 hours. Hemoglobin A1C: No results for input(s): HGBA1C in the last 72 hours. Fasting Lipid Panel: No results for input(s): CHOL, HDL, LDLCALC, TRIG, CHOLHDL, LDLDIRECT in the last 72 hours. Thyroid  Function Tests: No results for input(s): TSH, T4TOTAL, T3FREE, THYROIDAB in the last 72 hours.  Invalid input(s): FREET3 Anemia Panel: No results for input(s): VITAMINB12, FOLATE, FERRITIN, TIBC, IRON, RETICCTPCT in the last 72 hours.   Radiology: US  ABDOMEN LIMITED RUQ (LIVER/GB) Result Date: 12/08/2024 EXAM: Right Upper Quadrant Abdominal Ultrasound 12/08/2024 05:56:34 PM TECHNIQUE: Real-time ultrasonography of the right upper quadrant of the abdomen was performed. COMPARISON: Ultrasound abdomen 08/02/2016 and CT chest abdomen and pelvis 06/21/2014. CLINICAL HISTORY: RUQ pain. FINDINGS: LIVER: The liver demonstrates normal echogenicity. No intrahepatic biliary ductal dilatation. No evidence of mass. BILIARY SYSTEM: Cholelithiasis with multiple stones and sludge in the gallbladder. Stone seen in the gallbladder neck. Gallbladder wall thickening measuring up to 9 mm. No pericholecystic fluid. Changes may represent acute cholecystitis in the appropriate clinical setting. Common bile duct measures 4 mm in diameter. RIGHT KIDNEY: The right kidney is grossly unremarkable in appearances without evidence of hydronephrosis, echogenic calculi or  worrisome mass lesions.  PANCREAS: Visualized portions of the pancreas are unremarkable. OTHER: No right upper quadrant ascites. IMPRESSION: 1. Cholelithiasis with multiple stones and sludge in the gallbladder, including a stone in the gallbladder neck. 2. Gallbladder wall thickening measuring up to 9 mm. 3. Findings may represent acute cholecystitis in the appropriate clinical setting. Electronically signed by: Elsie Gravely MD 12/08/2024 06:03 PM EST RP Workstation: [id number]    ECHO 10/2023: MILD LEFT VENTRICULAR SYSTOLIC DYSFUNCTION WITH NO LVH  ESTIMATED EF: 45%, CALC EF(2D): 47%  NORMAL LA PRESSURES WITH DIASTOLIC DYSFUNCTION (GRADE 1)  NORMAL RIGHT VENTRICULAR SYSTOLIC FUNCTION  VALVULAR REGURGITATION: TRIVIAL AR, MILD MR, TRIVIAL PR, MILD TR  NO VALVULAR STENOSIS   TELEMETRY (personally reviewed): Ventricular pacing rate 70s  EKG (personally reviewed): Ventricular pacing rate 90 bpm  Data reviewed by me 12/09/2024: last 24h vitals tele labs imaging I/O ED provider note, admission H&P, hospitalist progress note, surgery notes  Principal Problem:   Gallstone Active Problems:   HTN (hypertension)   Arthritis, rheumatoid (HCC)   COPD with chronic bronchitis (HCC)   Pacemaker   Cholecystitis, acute   Chronic kidney disease, stage 3b (HCC)   Chronic systolic CHF (congestive heart failure) (HCC)   CAD (coronary artery disease)   Myocardial injury   Pulmonary fibrosis (HCC)   PAF (paroxysmal atrial fibrillation) (HCC)    ASSESSMENT AND PLAN:  Randy Lopez is a 89 y.o. 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  Found to have acute cholecystitis.  Given his history and need for surgery, cardiology was consulted for further  evaluation.   # Preoperative cardiac evaluation # Acute cholecystitis # Chronic HFmrEF # S/p PPM # Hypertension Patient presented with complaints of vomiting, abdominal pain and found to have acute cholecystitis on ultrasound.  Has extensive cardiac history but overall from a cardiac perspective has been doing well.  Denies any recent issues with chest pain, shortness of breath, palpitations, leg swelling.  EKG consistent with priors. - Patient is at moderate to high risk for proceeding with surgery given his cardiac history, however this is not prohibitive risk.  I discussed this in detail with patient and wife.  Okay to proceed from cardiac perspective understanding risk/benefits. - Recommend continuing home cardiac medications perioperatively. Resume ASA when ok per surgery. - Minimal and flat troponin most consistent with demand/supply mismatch and not ACS in the setting of acute cholecystitis, lactic acidosis and given that he is without any cardiac symptoms or recent functional decline.  This patient's plan of care was discussed and created with Dr. Florencio and he is in agreement.  Signed: Danita Bloch, PA-C  12/09/2024, 11:13 AM [facility] Card[facility]

## 2024-12-09 NOTE — Progress Notes (Signed)
° ° °  PROCEDURAL EXPEDITER PROGRESS NOTE  Patient Name: Randy Lopez  DOB:1931/05/07 Date of Admission: 12/08/2024  Date of Assessment:12/09/2024   -------------------------------------------------------------------------------------------------------------------   Brief clinical summary: 89 yr old male wit HX of CHF with EF of 45%, HTN, HLD, DM, COPD, pulomanary fibrosis, CAD s/p for pacemaker placement, GERD, CKD stage ,  negative for covid  Orders in place:  Yes   Communication with surgical team if no orders: Yes for increased troponin level  Labs, test, and orders reviewed: yes  Requires surgical clearance:  Yes  What type of clearance: cardiac   Clearance received: no  Barriers noted:  pt has rising Trop level and abnormal EKG   Intervention provided by [facility] team: reached out to surgeon Dr. Jordis by chat to review chart, reached out to dr. Hendrick mines to review chart by secure cahrt and Dr. Timothy Gollan by secure chaart  Barrier resolved:  no   -------------------------------------------------------------------------------------------------------------------  [facility] [employer], Ronal DELENA Bald Please contact us  directly via secure chat (search for [facility]) or by calling us  at [phone] [facility]).

## 2024-12-09 NOTE — Progress Notes (Signed)
 Pt otf for surgery

## 2024-12-09 NOTE — Progress Notes (Signed)
 PROGRESS NOTE    Randy Lopez  FMW:[id number] DOB: 07/03/1931 DOA: 12/08/2024 PCP: [name], MD   Brief Narrative:   Randy Lopez is a 89 y.o. male with medical history significant of sCHF with EF 45%, HTN, HLD, DM, COPD, pulmonary fibrosis, CAD, s/p for pacemaker placement, GERD, CKD stage IIIb, rheumatoid arthritis, who presents with nausea, vomiting, right upper quadrant abdominal pain.   Patient states that his symptoms started yesterday, including nausea and several episodes of nonbilious nonbloody vomiting, right upper quadrant abdominal pain, generalized weakness.     ER evaluation notable for white count of 14.8, lactic acid 2.5, elevated liver enzymes AST 133 ALP 100, total bilirubin 1.7, right upper quadrant ultrasound concerning for acute calculous cholecystitis.  Also troponin elevated to 52 and rising.  Troponin.  Patient admitted for further management.   General Surgery on board.  Patient plan for laparoscopic cholecystectomy today   Assessment & Plan:   Principal Problem:   Gallstone Active Problems:   Cholecystitis, acute   Chronic systolic CHF (congestive heart failure) (HCC)   CAD (coronary artery disease)   Myocardial injury   COPD with chronic bronchitis (HCC)   Pulmonary fibrosis (HCC)   HTN (hypertension)   Chronic kidney disease, stage 3b (HCC)   Arthritis, rheumatoid (HCC)   PAF (paroxysmal atrial fibrillation) (HCC)   Pacemaker    Gallstone and cholecystitis, acute:  Elevated liver enzymes -Right upper quadrant ultrasound concerning for calculous cholecystitis, liver enzymes are elevated with bilirubin stable at 1.7. GI on board.  Recommended MRCP however he is pacemaker is not compatible for MRI. Plan for lap chole later today with intraoperative cholangiogram.  Continue IV antibiotics, IV analgesics as needed Troponin elevated 57 which peaked to 77.   Evaluated by cardiology, felt to be demand ischemia, patient having no cardiac  symptoms, felt to be stable for procedure from cardiac standpoint.  Given his overall medical condition, advanced age, he is moderate risk  Chronic systolic CHF (congestive heart failure) (HCC): 2D echo 11/14/2023 showed EF of 45%.  Patient has elevated BNP 2093, but no SOB, no leg edema, does not seem to have CHF exacerbation. - Currently receiving IV fluid, holding torsemide  but will closely watch volume status.   CAD (coronary artery disease) and myocardial injury: Troponin 52 --> 54, likely demand ischemia. -Hold aspirin for possible surgery - Trend troponin   COPD with chronic bronchitis and pulmonary fibrosis: Stable.  No SOB -Bronchodilators as needed Mucinex    HTN (hypertension) -IV hydralazine as needed - Hold Lasix as above   Chronic kidney disease, stage 3b [facility]): Renal function stable.  Recent baseline creatinine 1.7 on 08/20/2024.  His creatinine is 1.43, BUN 28, GFR 46. -Follow-up with BMP   Arthritis, rheumatoid (HCC) -Methotrexate  weekly   PAF (paroxysmal atrial fibrillation) [facility]): Patient is not taking anticoagulants.  Heart rate 90s. -Monitor heart rate closely   Pacemaker in place:       DVT ppx: SCD   Code Status: DNR discussed by admitting provider   Family Communication:    Wife at the bedside   Disposition Plan:  Anticipate discharge back to previous environment   Consults called: Dr. Jordis of surgery, Dr. Youlanda, cardiology   Admission status and Level of care: Telemetryas inpt           Dispo: The patient is from: Home              Anticipated d/c is to: Home  Anticipated d/c date is: 2 days              Patient currently is not medically stable to d/c.       Severity of Illness:   The appropriate patient status for this patient is INPATIENT. Inpatient status is judged to be reasonable and necessary in order to provide the required intensity of service to ensure the patient's safety. The patient's presenting symptoms, physical  exam findings, and initial radiographic and laboratory data in the context of their chronic comorbidities is felt to place them at high risk for further clinical deterioration. Furthermore, it is not anticipated that the patient will be medically stable for discharge from the hospital within 2 midnights of admission.    * I certify that at the point of admission it is my clinical judgment that the patient will require inpatient hospital care spanning beyond 2 midnights from the point of admission due to high intensity of service, high risk for further deterioration and high frequency of surveillance required.*    Subjective:  Patient seen and examined at the bedside.  Reports of right upper quadrant pain managed with IV antibiotics, no nausea vomiting today.  Denies any chest pain, dizziness.  Palpitations.  Vital signs are stable.  Objective: Vitals:   12/08/24 1830 12/08/24 1954 12/08/24 2037 12/09/24 0428  BP: 139/78 (!) 151/70 (!) 150/74 (!) 143/64  Pulse: 90 87 87 83  Resp: 16 16 18 20   Temp:  97.8 F (36.6 C) 97.7 F (36.5 C) 98.5 F (36.9 C)  TempSrc:  Oral    SpO2: 99% 99% 99% 96%  Weight:   96 kg   Height:   6' (1.829 m)     Intake/Output Summary (Last 24 hours) at 12/09/2024 0753 Last data filed at 12/09/2024 0212 Gross per 24 hour  Intake 795.83 ml  Output 200 ml  Net 595.83 ml   Filed Weights   12/08/24 1629 12/08/24 2037  Weight: 96.7 kg 96 kg    Examination:  General: Alert, oriented not in any acute distress Chest: Clear to auscultation bilaterally, no wheezing or crepitations CVs: S1, S2, regular rhythm Abdomen: Soft, mild right upper quadrant tenderness, bowel sounds present Extremities: No edema  Data Reviewed: I have personally reviewed following labs and imaging studies  CBC: Recent Labs  Lab 12/08/24 1645 12/09/24 0207  WBC 14.8* 18.2*  NEUTROABS 13.3*  --   HGB 12.6* 12.4*  HCT 38.2* 36.2*  MCV 91.6 89.8  PLT 180 172   Basic Metabolic  Panel: Recent Labs  Lab 12/08/24 1645 12/09/24 0207  NA 139 138  K 3.9 3.9  CL 101 101  CO2 25 25  GLUCOSE 210* 151*  BUN 28* 27*  CREATININE 1.43* 1.42*  CALCIUM 9.6 9.2  MG 2.0  --    GFR: Estimated Creatinine Clearance: 40.7 mL/min (A) (by C-G formula based on SCr of 1.42 mg/dL (H)). Liver Function Tests: Recent Labs  Lab 12/08/24 1645 12/09/24 0207  AST 133* 114*  ALT 79* 99*  ALKPHOS 100 101  BILITOT 1.7* 1.5*  PROT 7.5 6.9  ALBUMIN 4.0 3.7   Recent Labs  Lab 12/08/24 1645  LIPASE 36   No results for input(s): AMMONIA in the last 168 hours. Coagulation Profile: Recent Labs  Lab 12/08/24 1645  INR 1.2   Cardiac Enzymes: No results for input(s): CKTOTAL, CKMB, CKMBINDEX, TROPONINI in the last 168 hours. BNP (last 3 results) Recent Labs    12/08/24 1645  PROBNP 2,093.0*  HbA1C: No results for input(s): HGBA1C in the last 72 hours. CBG: Recent Labs  Lab 12/08/24 2137  GLUCAP 182*   Lipid Profile: No results for input(s): CHOL, HDL, LDLCALC, TRIG, CHOLHDL, LDLDIRECT in the last 72 hours. Thyroid  Function Tests: No results for input(s): TSH, T4TOTAL, FREET4, T3FREE, THYROIDAB in the last 72 hours. Anemia Panel: No results for input(s): VITAMINB12, FOLATE, FERRITIN, TIBC, IRON, RETICCTPCT in the last 72 hours. Sepsis Labs: Recent Labs  Lab 12/08/24 1645 12/08/24 1908 12/08/24 2218 12/09/24 0207  LATICACIDVEN 2.5* 3.8* 2.3* 2.4*    Recent Results (from the past 240 hours)  Resp panel by RT-PCR (RSV, Flu A&B, Covid) Anterior Nasal Swab     Status: None   Collection Time: 12/08/24  5:07 PM   Specimen: Anterior Nasal Swab  Result Value Ref Range Status   SARS Coronavirus 2 by RT PCR NEGATIVE NEGATIVE Final    Comment: (NOTE) SARS-CoV-2 target nucleic acids are NOT DETECTED.  The SARS-CoV-2 RNA is generally detectable in upper respiratory specimens during the acute phase of infection. The  lowest concentration of SARS-CoV-2 viral copies this assay can detect is 138 copies/mL. A negative result does not preclude SARS-Cov-2 infection and should not be used as the sole basis for treatment or other patient management decisions. A negative result may occur with  improper specimen collection/handling, submission of specimen other than nasopharyngeal swab, presence of viral mutation(s) within the areas targeted by this assay, and inadequate number of viral copies(<138 copies/mL). A negative result must be combined with clinical observations, patient history, and epidemiological information. The expected result is Negative.  Fact Sheet for Patients:  bloggercourse.com  Fact Sheet for Healthcare Providers:  seriousbroker.it  This test is no t yet approved or cleared by the [place]  FDA and  has been authorized for detection and/or diagnosis of SARS-CoV-2 by FDA under an Emergency Use Authorization (EUA). This EUA will remain  in effect (meaning this test can be used) for the duration of the COVID-19 declaration under Section 564(b)(1) of the Act, 21 U.S.C.section 360bbb-3(b)(1), unless the authorization is terminated  or revoked sooner.       Influenza A by PCR NEGATIVE NEGATIVE Final   Influenza B by PCR NEGATIVE NEGATIVE Final    Comment: (NOTE) The Xpert Xpress SARS-CoV-2/FLU/RSV plus assay is intended as an aid in the diagnosis of influenza from Nasopharyngeal swab specimens and should not be used as a sole basis for treatment. Nasal washings and aspirates are unacceptable for Xpert Xpress SARS-CoV-2/FLU/RSV testing.  Fact Sheet for Patients: bloggercourse.com  Fact Sheet for Healthcare Providers: seriousbroker.it  This test is not yet approved or cleared by the [place]  FDA and has been authorized for detection and/or diagnosis of SARS-CoV-2 by FDA under  an Emergency Use Authorization (EUA). This EUA will remain in effect (meaning this test can be used) for the duration of the COVID-19 declaration under Section 564(b)(1) of the Act, 21 U.S.C. section 360bbb-3(b)(1), unless the authorization is terminated or revoked.     Resp Syncytial Virus by PCR NEGATIVE NEGATIVE Final    Comment: (NOTE) Fact Sheet for Patients: bloggercourse.com  Fact Sheet for Healthcare Providers: seriousbroker.it  This test is not yet approved or cleared by the [place]  FDA and has been authorized for detection and/or diagnosis of SARS-CoV-2 by FDA under an Emergency Use Authorization (EUA). This EUA will remain in effect (meaning this test can be used) for the duration of the COVID-19 declaration under Section 564(b)(1) of the Act, 21  U.S.C. section 360bbb-3(b)(1), unless the authorization is terminated or revoked.  Performed at [facility], [address] [place], [place] [address]   Blood culture (routine x 2)     Status: None (Preliminary result)   Collection Time: 12/08/24  7:08 PM   Specimen: BLOOD  Result Value Ref Range Status   Specimen Description BLOOD BLOOD RIGHT FOREARM  Final   Special Requests   Final    BOTTLES DRAWN AEROBIC AND ANAEROBIC Blood Culture results may not be optimal due to an inadequate volume of blood received in culture bottles   Culture   Final    NO GROWTH < 12 HOURS Performed at [facility], [address], [place], [place] [address]    Report Status PENDING  Incomplete  Blood culture (routine x 2)     Status: None (Preliminary result)   Collection Time: 12/08/24  7:08 PM   Specimen: BLOOD  Result Value Ref Range Status   Specimen Description BLOOD LEFT ANTECUBITAL  Final   Special Requests   Final    BOTTLES DRAWN AEROBIC AND ANAEROBIC Blood Culture results may not be optimal due to an inadequate volume of blood received in culture bottles    Culture   Final    NO GROWTH < 12 HOURS Performed at [facility], [address], [place], [place] [address]    Report Status PENDING  Incomplete         Radiology Studies: US  ABDOMEN LIMITED RUQ (LIVER/GB) Result Date: 12/08/2024 EXAM: Right Upper Quadrant Abdominal Ultrasound 12/08/2024 05:56:34 PM TECHNIQUE: Real-time ultrasonography of the right upper quadrant of the abdomen was performed. COMPARISON: Ultrasound abdomen 08/02/2016 and CT chest abdomen and pelvis 06/21/2014. CLINICAL HISTORY: RUQ pain. FINDINGS: LIVER: The liver demonstrates normal echogenicity. No intrahepatic biliary ductal dilatation. No evidence of mass. BILIARY SYSTEM: Cholelithiasis with multiple stones and sludge in the gallbladder. Stone seen in the gallbladder neck. Gallbladder wall thickening measuring up to 9 mm. No pericholecystic fluid. Changes may represent acute cholecystitis in the appropriate clinical setting. Common bile duct measures 4 mm in diameter. RIGHT KIDNEY: The right kidney is grossly unremarkable in appearances without evidence of hydronephrosis, echogenic calculi or worrisome mass lesions. PANCREAS: Visualized portions of the pancreas are unremarkable. OTHER: No right upper quadrant ascites. IMPRESSION: 1. Cholelithiasis with multiple stones and sludge in the gallbladder, including a stone in the gallbladder neck. 2. Gallbladder wall thickening measuring up to 9 mm. 3. Findings may represent acute cholecystitis in the appropriate clinical setting. Electronically signed by: Elsie Gravely MD 12/08/2024 06:03 PM EST RP Workstation: [id number]        Scheduled Meds:  folic acid   1 mg Oral Daily   insulin  aspart  0-5 Units Subcutaneous QHS   insulin  aspart  0-9 Units Subcutaneous TID WC   methotrexate   15 mg Oral Weekly   pantoprazole   40 mg Oral Daily   Continuous Infusions:  lactated ringers  50 mL/hr at 12/09/24 0211   piperacillin -tazobactam (ZOSYN )  IV 3.375 g (12/09/24  9787)          Abriella Filkins, MD [facility] 12/09/2024, 7:53 AM

## 2024-12-09 NOTE — Anesthesia Preprocedure Evaluation (Addendum)
 Anesthesia Evaluation  Patient identified by MRN, date of birth, ID band Patient awake    Reviewed: Allergy & Precautions, NPO status , Patient's Chart, lab work & pertinent test results  History of Anesthesia Complications Negative for: history of anesthetic complications  Airway Mallampati: III  TM Distance: >3 FB Neck ROM: full    Dental  (+) Missing   Pulmonary sleep apnea and Continuous Positive Airway Pressure Ventilation , COPD,  COPD inhaler, former smoker   Pulmonary exam normal        Cardiovascular hypertension, On Medications + CAD and +CHF  + dysrhythmias Atrial Fibrillation + pacemaker      Neuro/Psych negative neurological ROS  negative psych ROS   GI/Hepatic negative GI ROS, Neg liver ROS,,,  Endo/Other  diabetes, Type 2    Renal/GU CRFRenal disease  negative genitourinary   Musculoskeletal   Abdominal   Peds  Hematology  (+) Blood dyscrasia, anemia   Anesthesia Other Findings Past Medical History: No date: Acute respiratory failure (HCC) No date: Anemia, unspecified No date: Arrhythmia     Comment:  A-Fib No date: BPH (benign prostatic hyperplasia) No date: Chronic kidney disease No date: Coronary atherosclerosis of native coronary artery No date: Dupuytren's contracture No date: ED (erectile dysfunction) No date: Essential hypertension, benign No date: Other and unspecified hyperlipidemia No date: Rheumatoid arthritis(714.0) No date: Sleep apnea No date: Trigger finger (acquired) No date: Type II or unspecified type diabetes mellitus without  mention of complication, not stated as uncontrolled  Past Surgical History: 05/30/2016: ESOPHAGOGASTRODUODENOSCOPY (EGD) WITH PROPOFOL ; N/A     Comment:  Procedure: ESOPHAGOGASTRODUODENOSCOPY (EGD) WITH               PROPOFOL ;  Surgeon: [name], MD;  Location: [facility]              ENDOSCOPY;  Service: Endoscopy;  Laterality: N/A; No date:  INSERT / REPLACE / REMOVE PACEMAKER 04/02/2024: PPM GENERATOR CHANGEOUT; N/A     Comment:  Procedure: PPM GENERATOR CHANGEOUT;  Surgeon: [name], MD;  Location: [facility] INVASIVE CV LAB;  Service:              Cardiovascular;  Laterality: N/A;  BMI    Body Mass Index: 28.70 kg/m      Reproductive/Obstetrics negative OB ROS                              Anesthesia Physical Anesthesia Plan  ASA: 3  Anesthesia Plan: General ETT   Post-op Pain Management: Toradol IV (intra-op)*, Ofirmev  IV (intra-op)* and Dilaudid IV   Induction: Intravenous  PONV Risk Score and Plan: 2 and Ondansetron , Dexamethasone, Midazolam  and Treatment may vary due to age or medical condition  Airway Management Planned: Oral ETT  Additional Equipment:   Intra-op Plan:   Post-operative Plan: Extubation in OR  Informed Consent: I have reviewed the patients History and Physical, chart, labs and discussed the procedure including the risks, benefits and alternatives for the proposed anesthesia with the patient or authorized representative who has indicated his/her understanding and acceptance.   Patient has DNR.  Suspend DNR and Discussed DNR with patient.   Dental Advisory Given  Plan Discussed with: Anesthesiologist, CRNA and Surgeon  Anesthesia Plan Comments: (Patient consented for risks of anesthesia including but not limited to:  - adverse reactions to medications - damage to eyes, teeth,  lips or other oral mucosa - nerve damage due to positioning  - sore throat or hoarseness - Damage to heart, brain, nerves, lungs, other parts of body or loss of life  Patient voiced understanding and assent.)         Anesthesia Quick Evaluation

## 2024-12-09 NOTE — Progress Notes (Signed)
 MD Santosh Sigdel informed of trop of 19

## 2024-12-09 NOTE — Anesthesia Postprocedure Evaluation (Signed)
 Anesthesia Post Note  Patient: Randy Lopez  Procedure(s) Performed: LAPAROSCOPIC CHOLECYSTECTOMY WITH INTRAOPERATIVE CHOLANGIOGRAM (Abdomen) CHOLANGIOGRAM, INTRAOPERATIVE (Abdomen)  Patient location during evaluation: PACU Anesthesia Type: General Level of consciousness: awake and alert Pain management: pain level controlled Vital Signs Assessment: post-procedure vital signs reviewed and stable Respiratory status: spontaneous breathing, nonlabored ventilation, respiratory function stable and patient connected to nasal cannula oxygen Cardiovascular status: blood pressure returned to baseline and stable Postop Assessment: no apparent nausea or vomiting Anesthetic complications: no   No notable events documented.   Last Vitals:  Vitals:   12/09/24 1600 12/09/24 1610  BP: 139/65 (!) 160/74  Pulse: 85 82  Resp: 18 18  Temp:  36.7 C  SpO2: 95% 95%    Last Pain:  Vitals:   12/09/24 1610  TempSrc:   PainSc: 0-No pain                 [name]

## 2024-12-09 NOTE — Transfer of Care (Signed)
 Immediate Anesthesia Transfer of Care Note  Patient: Randy Lopez  Procedure(s) Performed: LAPAROSCOPIC CHOLECYSTECTOMY WITH INTRAOPERATIVE CHOLANGIOGRAM (Abdomen) CHOLANGIOGRAM, INTRAOPERATIVE (Abdomen)  Patient Location: PACU  Anesthesia Type:General  Level of Consciousness: drowsy and patient cooperative  Airway & Oxygen Therapy: Patient Spontanous Breathing and Patient connected to nasal cannula oxygen  Post-op Assessment: Report given to RN and Post -op Vital signs reviewed and stable  Post vital signs: Reviewed and stable  Last Vitals:  Vitals Value Taken Time  BP 141/69 12/09/24 15:24  Temp 36.8 C 12/09/24 15:24  Pulse 86 12/09/24 15:29  Resp 18 12/09/24 15:29  SpO2 100 % 12/09/24 15:29  Vitals shown include unfiled device data.  Last Pain:  Vitals:   12/09/24 1524  TempSrc:   PainSc: Asleep         Complications: No notable events documented.

## 2024-12-10 ENCOUNTER — Other Ambulatory Visit: Payer: Self-pay

## 2024-12-10 ENCOUNTER — Encounter: Payer: Self-pay | Admitting: Surgery

## 2024-12-10 LAB — COMPREHENSIVE METABOLIC PANEL WITH GFR
ALT: 73 U/L — ABNORMAL HIGH (ref 0–44)
AST: 91 U/L — ABNORMAL HIGH (ref 15–41)
Albumin: 3.2 g/dL — ABNORMAL LOW (ref 3.5–5.0)
Alkaline Phosphatase: 101 U/L (ref 38–126)
Anion gap: 13 (ref 5–15)
BUN: 34 mg/dL — ABNORMAL HIGH (ref 8–23)
CO2: 23 mmol/L (ref 22–32)
Calcium: 8.6 mg/dL — ABNORMAL LOW (ref 8.9–10.3)
Chloride: 101 mmol/L (ref 98–111)
Creatinine, Ser: 1.35 mg/dL — ABNORMAL HIGH (ref 0.61–1.24)
GFR, Estimated: 50 mL/min — ABNORMAL LOW (ref >60)
Glucose, Bld: 138 mg/dL — ABNORMAL HIGH (ref 70–99)
Potassium: 3.6 mmol/L (ref 3.5–5.1)
Sodium: 137 mmol/L (ref 135–145)
Total Bilirubin: 1.1 mg/dL (ref 0.0–1.2)
Total Protein: 6.4 g/dL — ABNORMAL LOW (ref 6.5–8.1)

## 2024-12-10 LAB — CBC WITH DIFFERENTIAL/PLATELET
Abs Immature Granulocytes: 0.13 K/uL — ABNORMAL HIGH (ref 0.00–0.07)
Basophils Absolute: 0 K/uL (ref 0.0–0.1)
Basophils Relative: 0 %
Eosinophils Absolute: 0 K/uL (ref 0.0–0.5)
Eosinophils Relative: 0 %
HCT: 34.5 % — ABNORMAL LOW (ref 39.0–52.0)
Hemoglobin: 11.7 g/dL — ABNORMAL LOW (ref 13.0–17.0)
Immature Granulocytes: 1 %
Lymphocytes Relative: 5 %
Lymphs Abs: 0.7 K/uL (ref 0.7–4.0)
MCH: 30.9 pg (ref 26.0–34.0)
MCHC: 33.9 g/dL (ref 30.0–36.0)
MCV: 91 fL (ref 80.0–100.0)
Monocytes Absolute: 1.1 K/uL — ABNORMAL HIGH (ref 0.1–1.0)
Monocytes Relative: 8 %
Neutro Abs: 11.7 K/uL — ABNORMAL HIGH (ref 1.7–7.7)
Neutrophils Relative %: 86 %
Platelets: 125 K/uL — ABNORMAL LOW (ref 150–400)
RBC: 3.79 MIL/uL — ABNORMAL LOW (ref 4.22–5.81)
RDW: 15.8 % — ABNORMAL HIGH (ref 11.5–15.5)
WBC: 13.7 K/uL — ABNORMAL HIGH (ref 4.0–10.5)
nRBC: 0 % (ref 0.0–0.2)

## 2024-12-10 LAB — GLUCOSE, CAPILLARY
Glucose-Capillary: 144 mg/dL — ABNORMAL HIGH (ref 70–99)
Glucose-Capillary: 173 mg/dL — ABNORMAL HIGH (ref 70–99)

## 2024-12-10 MED ORDER — SENNOSIDES-DOCUSATE SODIUM 8.6-50 MG PO TABS
2.0000 | ORAL_TABLET | Freq: Two times a day (BID) | ORAL | Status: DC
  Filled 2024-12-10: qty 2

## 2024-12-10 MED ORDER — POLYETHYLENE GLYCOL 3350 17 G PO PACK: 17.0000 g | PACK | Freq: Two times a day (BID) | ORAL | Status: DC | PRN

## 2024-12-10 MED ORDER — POLYETHYLENE GLYCOL 3350 17 G PO PACK
17.0000 g | PACK | Freq: Two times a day (BID) | ORAL | Status: DC
  Administered 2024-12-10: 10:00:00 17 g via ORAL
  Filled 2024-12-10: qty 1

## 2024-12-10 MED ORDER — SENNOSIDES-DOCUSATE SODIUM 8.6-50 MG PO TABS: 1.0000 | ORAL_TABLET | Freq: Two times a day (BID) | ORAL | Status: DC | PRN

## 2024-12-10 MED ORDER — AMOXICILLIN-POT CLAVULANATE 875-125 MG PO TABS
1.0000 | ORAL_TABLET | Freq: Two times a day (BID) | ORAL | 0 refills | Status: AC
  Filled 2024-12-10: qty 12, 6d supply, fill #0

## 2024-12-10 MED ORDER — POLYETHYLENE GLYCOL 3350 17 GM/SCOOP PO POWD
17.0000 g | Freq: Two times a day (BID) | ORAL | 2 refills | Status: AC | PRN
  Filled 2024-12-10: qty 238, 7d supply, fill #0

## 2024-12-10 MED ORDER — SENNOSIDES-DOCUSATE SODIUM 8.6-50 MG PO TABS: 1.0000 | ORAL_TABLET | Freq: Two times a day (BID) | ORAL | Status: AC | PRN

## 2024-12-10 NOTE — Care Management Important Message (Signed)
 Important Message  Patient Details  Name: Randy Lopez MRN: [id number] Date of Birth: 1931-05-06   Important Message Given:  Yes - Medicare IM     Rojelio SHAUNNA Rattler 12/10/2024, 1:49 PM

## 2024-12-10 NOTE — Plan of Care (Signed)

## 2024-12-10 NOTE — Evaluation (Signed)
 Physical Therapy Evaluation Patient Details Name: Randy Lopez MRN: [id number] DOB: Oct 22, 1931 Today's Date: 12/10/2024  History of Present Illness  89 y.o. male s/p laparoscopic cholecystectomy and IOC for acute cholecystitis  Clinical Impression  Patient received in recliner. Family/friends present in room. He is agreeable to PT assessment. Patient stands from recliner without assistance. Ambulated 150 feet without AD and supervision. No lob, appears to be at baseline level of function. No further skilled needs at this time. Signing off.        If plan is discharge home, recommend the following: Help with stairs or ramp for entrance;Assist for transportation   Can travel by private vehicle    yes    Equipment Recommendations None recommended by PT  Recommendations for Other Services       Functional Status Assessment Patient has not had a recent decline in their functional status     Precautions / Restrictions Precautions Precautions: None Recall of Precautions/Restrictions: Intact Precaution/Restrictions Comments: mod fall Restrictions Weight Bearing Restrictions Per Provider Order: No      Mobility  Bed Mobility               General bed mobility comments: NT patient in recliner    Transfers Overall transfer level: Independent Equipment used: None Transfers: Sit to/from Stand Sit to Stand: Independent                Ambulation/Gait Ambulation/Gait assistance: Supervision Gait Distance (Feet): 150 Feet Assistive device: None Gait Pattern/deviations: Step-through pattern, Decreased step length - right, Decreased step length - left, Decreased stride length, Trunk flexed Gait velocity: WNL     General Gait Details: patient ambulated 150 feet no AD, no hands on assist.  Stairs            Wheelchair Mobility     Tilt Bed    Modified Rankin (Stroke Patients Only)       Balance Overall balance assessment:  Independent Sitting-balance support: Feet supported Sitting balance-Leahy Scale: Normal     Standing balance support: No upper extremity supported, During functional activity Standing balance-Leahy Scale: Good                               Pertinent Vitals/Pain Pain Assessment Pain Assessment: No/denies pain    Home Living Family/patient expects to be discharged to:: Private residence Living Arrangements: Spouse/significant other Available Help at Discharge: Family;Available 24 hours/day Type of Home: House Home Access: Level entry   Entrance Stairs-Number of Steps: level in back   Home Layout: One level Home Equipment: Agricultural Consultant (2 wheels);Cane - single point      Prior Function Prior Level of Function : Independent/Modified Independent                     Extremity/Trunk Assessment   Upper Extremity Assessment Upper Extremity Assessment: Defer to OT evaluation    Lower Extremity Assessment Lower Extremity Assessment: Overall WFL for tasks assessed    Cervical / Trunk Assessment Cervical / Trunk Assessment: Kyphotic (flexed posture)  Communication   Communication Communication: No apparent difficulties    Cognition Arousal: Alert Behavior During Therapy: WFL for tasks assessed/performed   PT - Cognitive impairments: No apparent impairments                         Following commands: Intact       Cueing Cueing Techniques: Verbal cues  General Comments      Exercises     Assessment/Plan    PT Assessment Patient does not need any further PT services  PT Problem List         PT Treatment Interventions      PT Goals (Current goals can be found in the Care Plan section)  Acute Rehab PT Goals Patient Stated Goal: return home PT Goal Formulation: With patient/family Time For Goal Achievement: 12/12/24 Potential to Achieve Goals: Good    Frequency       Co-evaluation               AM-PAC PT 6  Clicks Mobility  Outcome Measure Help needed turning from your back to your side while in a flat bed without using bedrails?: None Help needed moving from lying on your back to sitting on the side of a flat bed without using bedrails?: None Help needed moving to and from a bed to a chair (including a wheelchair)?: None Help needed standing up from a chair using your arms (e.g., wheelchair or bedside chair)?: None Help needed to walk in hospital room?: None Help needed climbing 3-5 steps with a railing? : None 6 Click Score: 24    End of Session   Activity Tolerance: Patient tolerated treatment well Patient left: in chair;with call bell/phone within reach;with family/visitor present;with nursing/sitter in room Nurse Communication: Mobility status      Time: 8694-8686 PT Time Calculation (min) (ACUTE ONLY): 8 min   Charges:   PT Evaluation $PT Eval Low Complexity: 1 Low   PT General Charges $$ ACUTE PT VISIT: 1 Visit         Yisel Megill, PT, GCS 12/10/2024,1:18 PM

## 2024-12-10 NOTE — Plan of Care (Signed)
 IV's removed, Rx delivered to room, discharge instructions reviewed with patient, wife and granddaughter, family instructed on how to care for JP drain and empty.  Supplies given for dressing change.

## 2024-12-10 NOTE — Progress Notes (Signed)
 [facility] CLINIC CARDIOLOGY PROGRESS NOTE       Patient ID: Randy Lopez MRN: [id number] DOB/AGE: 88/09/1931 88 y.o.  Admit date: 12/08/2024 Referring Physician Dr. Derryl Lopez Primary Physician Randy Ophelia JINNY DOUGLAS, MD  Primary Cardiologist Dr. Ammon Reason for Consultation POC  HPI: Randy Lopez is a 89 y.o. 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  Found to have acute cholecystitis.  Given his history and need for surgery, cardiology was consulted for further evaluation.   Interval history: -Patient seen and examined this AM, resting in hospital bed.  -States he is feeling well overall. Only complains of mild soreness in his abdomen.  -Denies CP, SOB, palpitations. No events noted on tele.   Review of systems complete and found to be negative unless listed above    Past Medical History:  Diagnosis Date   Acute respiratory failure (HCC)    Anemia, unspecified    Arrhythmia    A-Fib   BPH (benign prostatic hyperplasia)    Chronic kidney disease    Coronary atherosclerosis of native coronary artery    Dupuytren's contracture    ED (erectile dysfunction)    Essential hypertension, benign    Other and unspecified hyperlipidemia    Rheumatoid arthritis(714.0)    Sleep apnea    Trigger finger (acquired)    Type II or unspecified type diabetes mellitus without mention of complication, not stated as uncontrolled     Past Surgical History:  Procedure Laterality Date   CHOLECYSTECTOMY N/A 12/09/2024   Procedure: LAPAROSCOPIC CHOLECYSTECTOMY WITH INTRAOPERATIVE CHOLANGIOGRAM;  Surgeon: [name], MD;  Location: [facility] ORS;  Service: General;  Laterality: N/A;   ESOPHAGOGASTRODUODENOSCOPY (EGD) WITH PROPOFOL  N/A  05/30/2016   Procedure: ESOPHAGOGASTRODUODENOSCOPY (EGD) WITH PROPOFOL ;  Surgeon: [name], MD;  Location: [facility] ENDOSCOPY;  Service: Endoscopy;  Laterality: N/A;   INSERT / REPLACE / REMOVE PACEMAKER     INTRAOPERATIVE CHOLANGIOGRAM  12/09/2024   Procedure: CHOLANGIOGRAM, INTRAOPERATIVE;  Surgeon: [name], MD;  Location: [facility] ORS;  Service: General;;   PPM GENERATOR CHANGEOUT N/A 04/02/2024   Procedure: PPM GENERATOR CHANGEOUT;  Surgeon: Randy Lopez Blunt, MD;  Location: [facility] INVASIVE CV LAB;  Service: Cardiovascular;  Laterality: N/A;    Medications Prior to Admission  Medication Sig Dispense Refill Last Dose/Taking   aspirin 81 MG tablet Take 81 mg by mouth daily.   Past Week   betamethasone  dipropionate 0.05 % cream Apply topically 2 (two) times daily. To the foreskin 30 g 0 Past Week   carboxymethylcellulose (REFRESH PLUS) 0.5 % SOLN Place 1 drop into both eyes daily as needed (dry eyes).   Unknown   Cholecalciferol (VITAMIN D3) 1000 UNITS CAPS Take 1,000 Units by mouth daily.   Past Week   diclofenac  Sodium (VOLTAREN ) 1 % GEL Apply 2 grams topically to the affected areas 3 (three) times daily. 100 g 11 Past Week   empagliflozin  (JARDIANCE ) 10 MG TABS tablet Take 1 tablet (10 mg total) by mouth daily. 90 tablet 3 Past Week   folic acid  (FOLVITE ) 1 MG tablet Take 1 tablet (1 mg total) by mouth daily. 90 tablet 3 Past Week   methotrexate  (RHEUMATREX) 2.5 MG tablet Take 6 tablets (15 mg total) by mouth every 7 days. 72 tablet 1 12/01/2024  nystatin  cream (MYCOSTATIN ) Apply 1 Application topically 2 (two) times daily. Inside the foreskin with a Q-tip 30 g 0 Past Week   pantoprazole  (PROTONIX ) 40 MG tablet Take 1 tablet (40 mg total) by mouth daily. 90 tablet 1 Past Week   pioglitazone  (ACTOS ) 15 MG tablet Take 1 tablet (15 mg total) by mouth daily. 90 tablet 3 Past Week   triamcinolone ointment (KENALOG) 0.1 % Apply 1 Application topically 2 (two) times daily.   Unknown   glucose  blood (TRUE METRIX BLOOD GLUCOSE TEST) test strip use as directed to test daily 100 each 3    TRUE METRIX BLOOD GLUCOSE TEST test strip SMARTSIG:Via Meter      TRUEplus Lancets 33G MISC       TRUEplus Lancets 33G MISC use as directed to test blood sugar daily 100 each 1    Social History   Socioeconomic History   Marital status: Married    Spouse name: Not on file   Number of children: Not on file   Years of education: Not on file   Highest education level: Not on file  Occupational History   Not on file  Tobacco Use   Smoking status: Former    Current packs/day: 0.00    Average packs/day: 1 pack/day for 30.0 years (30.0 ttl pk-yrs)    Types: Cigarettes    Start date: 02/04/1951    Quit date: 02/04/1981    Years since quitting: 43.8    Passive exposure: Past   Smokeless tobacco: Former  Substance and Sexual Activity   Alcohol  use: No   Drug use: No   Sexual activity: Not Currently  Other Topics Concern   Not on file  Social History Narrative   Not on file   Social Drivers of Health   Tobacco Use: Medium Risk (12/09/2024)   Patient History    Smoking Tobacco Use: Former    Smokeless Tobacco Use: Former    Passive Exposure: Past  Physicist, Medical Strain: Low Risk  (08/04/2024)   Received from [employer]   Overall Financial Resource Strain (CARDIA)    Difficulty of Paying Living Expenses: Not hard at all  Food Insecurity: No Food Insecurity (12/09/2024)   Epic    Worried About Radiation Protection Practitioner of Food in the Last Year: Never true    Ran Out of Food in the Last Year: Never true  Transportation Needs: No Transportation Needs (12/09/2024)   Epic    Lack of Transportation (Medical): No    Lack of Transportation (Non-Medical): No  Physical Activity: Not on file  Stress: Not on file  Social Connections: Unknown (12/09/2024)   Social Connection and Isolation Panel    Frequency of Communication with Friends and Family: Never    Frequency of Social  Gatherings with Friends and Family: Never    Attends Religious Services: Never    Database Administrator or Organizations: Not on file    Attends Banker Meetings: Never    Marital Status: Not on file  Intimate Partner Violence: Not At Risk (12/09/2024)   Epic    Fear of Current or Ex-Partner: No    Emotionally Abused: No    Physically Abused: No    Sexually Abused: No  Depression (PHQ2-9): Not on file  Alcohol  Screen: Not on file  Housing: Low Risk (12/09/2024)   Epic    Unable to Pay for Housing in the Last Year: No    Number of Times Moved in the Last  Year: 0    Homeless in the Last Year: No  Utilities: Not At Risk (12/09/2024)   Epic    Threatened with loss of utilities: No  Health Literacy: Not on file    Family History  Problem Relation Age of Onset   Diabetes Mother    Heart Problems Father      Vitals:   12/09/24 1651 12/09/24 2044 12/10/24 0439 12/10/24 0444  BP: (!) 157/72 (!) 128/59 119/69   Pulse: 83 78 80   Resp: 18 20 20    Temp: 97.8 F (36.6 C) 97.7 F (36.5 C) 98 F (36.7 C)   TempSrc:  Oral    SpO2: 96% 98% 95%   Weight:    101.2 kg  Height:        PHYSICAL EXAM General: Well-appearing elderly male, well nourished, in no acute distress. HEENT: Normocephalic and atraumatic. Neck: No JVD.  Lungs: Normal respiratory effort on room air. Clear bilaterally to auscultation. No wheezes, crackles, rhonchi.  Heart: HRRR. Normal S1 and S2 without gallops or murmurs.  Abdomen: Non-distended appearing.  Msk: Normal strength and tone for age. Extremities: Warm and well perfused. No clubbing, cyanosis.  No edema.  Neuro: Alert and oriented X 3. Psych: Answers questions appropriately.   Labs: Basic Metabolic Panel: Recent Labs    12/08/24 1645 12/09/24 0207 12/10/24 0435  NA 139 138 137  K 3.9 3.9 3.6  CL 101 101 101  CO2 25 25 23   GLUCOSE 210* 151* 138*  BUN 28* 27* 34*  CREATININE 1.43* 1.42* 1.35*  CALCIUM 9.6 9.2 8.6*  MG 2.0   --   --    Liver Function Tests: Recent Labs    12/09/24 0207 12/10/24 0435  AST 114* 91*  ALT 99* 73*  ALKPHOS 101 101  BILITOT 1.5* 1.1  PROT 6.9 6.4*  ALBUMIN 3.7 3.2*   Recent Labs    12/08/24 1645  LIPASE 36   CBC: Recent Labs    12/08/24 1645 12/09/24 0207 12/10/24 0435  WBC 14.8* 18.2* 13.7*  NEUTROABS 13.3*  --  11.7*  HGB 12.6* 12.4* 11.7*  HCT 38.2* 36.2* 34.5*  MCV 91.6 89.8 91.0  PLT 180 172 125*   Cardiac Enzymes: No results for input(s): CKTOTAL, CKMB, CKMBINDEX, TROPONINIHS in the last 72 hours. BNP: No results for input(s): BNP in the last 72 hours. D-Dimer: No results for input(s): DDIMER in the last 72 hours. Hemoglobin A1C: No results for input(s): HGBA1C in the last 72 hours. Fasting Lipid Panel: No results for input(s): CHOL, HDL, LDLCALC, TRIG, CHOLHDL, LDLDIRECT in the last 72 hours. Thyroid  Function Tests: No results for input(s): TSH, T4TOTAL, T3FREE, THYROIDAB in the last 72 hours.  Invalid input(s): FREET3 Anemia Panel: No results for input(s): VITAMINB12, FOLATE, FERRITIN, TIBC, IRON, RETICCTPCT in the last 72 hours.   Radiology: DG Cholangiogram Operative Result Date: 12/09/2024 EXAM: INTRAOPERATIVE CHOLANGIOGRAM TECHNIQUE: Fluoroscopy provided by the radiology department. Radiologist was not present during procedure. Image(s) submitted for review. FLUOROSCOPY DOSE AND TYPE: Radiation Dose Index: Reference Air Kerma (in mGy) = 8.08 Fluoroscopy time is 22 seconds. COMPARISON: Ultrasound abdomen 12/08/2024. CLINICAL HISTORY: [id number] Surgery, elective [id number] Surgery, elective [phone]. FINDINGS: Contrast injection of the cystic duct demonstrates no significant intra- or extrahepatic bile duct dilatation. Contrast material flows freely to the duodenum without focal filling defect. No stones or obstructing lesions identified. IMPRESSION: 1. No evidence of biliary obstruction. Electronically signed  by: Elsie Gravely MD 12/09/2024 07:16 PM EST RP Workstation: [id number]  US  ABDOMEN LIMITED RUQ (LIVER/GB) Result Date: 12/08/2024 EXAM: Right Upper Quadrant Abdominal Ultrasound 12/08/2024 05:56:34 PM TECHNIQUE: Real-time ultrasonography of the right upper quadrant of the abdomen was performed. COMPARISON: Ultrasound abdomen 08/02/2016 and CT chest abdomen and pelvis 06/21/2014. CLINICAL HISTORY: RUQ pain. FINDINGS: LIVER: The liver demonstrates normal echogenicity. No intrahepatic biliary ductal dilatation. No evidence of mass. BILIARY SYSTEM: Cholelithiasis with multiple stones and sludge in the gallbladder. Stone seen in the gallbladder neck. Gallbladder wall thickening measuring up to 9 mm. No pericholecystic fluid. Changes may represent acute cholecystitis in the appropriate clinical setting. Common bile duct measures 4 mm in diameter. RIGHT KIDNEY: The right kidney is grossly unremarkable in appearances without evidence of hydronephrosis, echogenic calculi or worrisome mass lesions. PANCREAS: Visualized portions of the pancreas are unremarkable. OTHER: No right upper quadrant ascites. IMPRESSION: 1. Cholelithiasis with multiple stones and sludge in the gallbladder, including a stone in the gallbladder neck. 2. Gallbladder wall thickening measuring up to 9 mm. 3. Findings may represent acute cholecystitis in the appropriate clinical setting. Electronically signed by: Elsie Gravely MD 12/08/2024 06:03 PM EST RP Workstation: [id number]    ECHO 10/2023: MILD LEFT VENTRICULAR SYSTOLIC DYSFUNCTION WITH NO LVH  ESTIMATED EF: 45%, CALC EF(2D): 47%  NORMAL LA PRESSURES WITH DIASTOLIC DYSFUNCTION (GRADE 1)  NORMAL RIGHT VENTRICULAR SYSTOLIC FUNCTION  VALVULAR REGURGITATION: TRIVIAL AR, MILD MR, TRIVIAL PR, MILD TR  NO VALVULAR STENOSIS   TELEMETRY (personally reviewed): Ventricular pacing rate 70s  EKG (personally reviewed): Ventricular pacing rate 90 bpm  Data reviewed by me 12/10/2024: last  24h vitals tele labs imaging I/O ED provider note, admission H&P, hospitalist progress note, surgery notes  Principal Problem:   Gallstone Active Problems:   HTN (hypertension)   Arthritis, rheumatoid (HCC)   COPD with chronic bronchitis (HCC)   Pacemaker   Cholecystitis   Chronic kidney disease, stage 3b (HCC)   Chronic systolic CHF (congestive heart failure) (HCC)   CAD (coronary artery disease)   Myocardial injury   Pulmonary fibrosis (HCC)   PAF (paroxysmal atrial fibrillation) (HCC)    ASSESSMENT AND PLAN:  Willian JAREL CUADRA is a 89 y.o. 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  Found to have acute cholecystitis.  Given his history and need for surgery, cardiology was consulted for further evaluation.   # Preoperative cardiac evaluation # Acute cholecystitis # Chronic HFmrEF # S/p PPM # Hypertension Patient presented with complaints of vomiting, abdominal pain and found to have acute cholecystitis on ultrasound.  Has extensive cardiac history but overall from a cardiac perspective has been doing well.  Denies any recent issues with chest pain, shortness of breath, palpitations, leg swelling.  EKG consistent with priors. - Recommend continuing home cardiac medications perioperatively. Resume ASA when ok per surgery. - Minimal and flat troponin most consistent with demand/supply mismatch and not ACS in the setting of acute cholecystitis, lactic acidosis and given that he is without any cardiac symptoms or recent functional decline.  Cardiology will sign off. Please haiku with questions or re-engage if needed. Follow up with Dr. Ammon in 2-3 weeks.   This patient's plan of care was discussed and created with Dr. Ammon and he  is in agreement.  Signed: Danita Bloch, PA-C  12/10/2024, 7:50 AM [facility] Cardiology

## 2024-12-10 NOTE — Progress Notes (Signed)
  [facility] SUR[facility] PRO[facility]ESS NOTE  Hospital Day(s): 2.   Post op day(s): 1 Day Post-Op.   Interval History:  Patient seen and examined No acute events or new complaints overnight.  Patient reports he is doing well Up eating breakfast  No fever, chills, nausea, emesis  Leukocytosis improved; WBC 13.7K Hgb to 11.7 sCr at baseline; 1.35 LFTS and Bilirubin improving Drain with 150 ccs; serosanguinous Diet advanced   Vital signs in last 24 hours: [min-max] current  Temp:  [97.2 F (36.2 C)-98.3 F (36.8 C)] 98 F (36.7 C) (12/18 0439) Pulse Rate:  [78-95] 80 (12/18 0439) Resp:  [16-20] 20 (12/18 0439) BP: (119-160)/(59-78) 119/69 (12/18 0439) SpO2:  [95 %-100 %] 95 % (12/18 0439) Weight:  [96 kg-101.2 kg] 101.2 kg (12/18 0444)     Height: 6' (182.9 cm) Weight: 101.2 kg BMI (Calculated): 30.25   Intake/Output last 2 shifts:  12/17 0701 - 12/18 0700 In: 1800 [P.O.:480; I.V.:1178.3; IV Piggyback:141.6] Out: 150 [Drains:150]   Physical Exam:  Constitutional: alert, cooperative and no distress  Respiratory: breathing non-labored at rest  Cardiovascular: regular rate and sinus rhythm  Gastrointestinal: soft, non-tender, and non-distended. Drain in right abdomen; output serosanguinous  Integumentary: Laparoscopic incisions are CDI with dermabond, no erythema or drainage   Labs:     Latest Ref Rng & Units 12/10/2024    4:35 AM 12/09/2024    2:07 AM 12/08/2024    4:45 PM  CBC  WBC 4.0 - 10.5 K/uL 13.7  18.2  14.8   Hemoglobin 13.0 - 17.0 g/dL 88.2  87.5  87.3   Hematocrit 39.0 - 52.0 % 34.5  36.2  38.2   Platelets 150 - 400 K/uL 125  172  180       Latest Ref Rng & Units 12/10/2024    4:35 AM 12/09/2024    2:07 AM 12/08/2024    4:45 PM  CMP  Glucose 70 - 99 mg/dL 861  848  789   BUN 8 - 23 mg/dL 34  27  28   Creatinine 0.61 - 1.24 mg/dL 8.64  8.57  8.56   Sodium 135 - 145 mmol/L 137  138  139   Potassium 3.5 - 5.1 mmol/L 3.6  3.9  3.9   Chloride  98 - 111 mmol/L 101  101  101   CO2 22 - 32 mmol/L 23  25  25    Calcium 8.9 - 10.3 mg/dL 8.6  9.2  9.6   Total Protein 6.5 - 8.1 g/dL 6.4  6.9  7.5   Total Bilirubin 0.0 - 1.2 mg/dL 1.1  1.5  1.7   Alkaline Phos 38 - 126 U/L 101  101  100   AST 15 - 41 U/L 91  114  133   ALT 0 - 44 U/L 73  99  79     Imaging studies: No new pertinent imaging studies   Assessment/Plan:  89 y.o. male 1 Day Post-Op s/p laparoscopic cholecystectomy and IOC for acute cholecystitis   - Okay to continue diet as tolerated; Reviewed dietary recommendations after cholecystectomy  - Continue surgical drain; monitor and record output daily - He will DC home with this.  - Continue IV Abx (Zosyn ); Okay for PO Augmentin  for home to complete 7 days    - Monitor abdominal examination   - Pain control prn; antiemetics prn - May resume home ASA   - Further management per primary service   - Discharge Planning: Okay for discharge from  surgical perspective. Will DC home with drain. Anticipate follow up on Monday 12/22. Abx as above.    All of the above findings and recommendations were discussed with the patient, patient's family (wife), and the medical team, and all of patient's and family's questions were answered to their expressed satisfaction.  -- Arthea Platt, PA-C [facility] 12/10/2024, 9:34 AM M-F: 7am - 4pm

## 2024-12-10 NOTE — Evaluation (Signed)
 Occupational Therapy Evaluation Patient Details Name: Randy Lopez MRN: [id number] DOB: 12-Jun-1931 Today's Date: 12/10/2024   History of Present Illness   89 y.o. male s/p laparoscopic cholecystectomy and IOC for acute cholecystitis     Clinical Impressions Mr Mcmanaway was seen for OT evaluation this date. Prior to hospital admission, pt was IND. Pt lives with spouse. Pt currently requires CGA no AD use toilet t/f, improves to SUPERVISION with single UE support on IV pole. Pt appears near functional baseline with strong family support, will sign off. Recommend no OT follow up on discharge.     If plan is discharge home, recommend the following:   Help with stairs or ramp for entrance     Functional Status Assessment   Patient has had a recent decline in their functional status and demonstrates the ability to make significant improvements in function in a reasonable and predictable amount of time.     Equipment Recommendations    none      Precautions/Restrictions   Precautions Precautions: Fall Recall of Precautions/Restrictions: Intact Restrictions Weight Bearing Restrictions Per Provider Order: No     Mobility Bed Mobility               General bed mobility comments: not tested    Transfers Overall transfer level: Needs assistance Equipment used: None Transfers: Sit to/from Stand Sit to Stand: Supervision                  Balance Overall balance assessment: Needs assistance Sitting-balance support: No upper extremity supported, Feet supported Sitting balance-Leahy Scale: Normal     Standing balance support: No upper extremity supported, During functional activity Standing balance-Leahy Scale: Fair                             ADL either performed or assessed with clinical judgement   ADL Overall ADL's : Needs assistance/impaired                                       General ADL Comments: CGA no AD use  toilet t/f, improves to SUPERVISION with single UE support on IV pole.                  Pertinent Vitals/Pain Pain Assessment Pain Assessment: No/denies pain     Extremity/Trunk Assessment Upper Extremity Assessment Upper Extremity Assessment: Overall WFL for tasks assessed   Lower Extremity Assessment Lower Extremity Assessment: Generalized weakness       Communication Communication Communication: No apparent difficulties   Cognition Arousal: Alert Behavior During Therapy: WFL for tasks assessed/performed Cognition: No apparent impairments                               Following commands: Intact                  Home Living Family/patient expects to be discharged to:: Private residence Living Arrangements: Spouse/significant other Available Help at Discharge: Family;Available 24 hours/day Type of Home: House Home Access: Stairs to enter [employer] of Steps: 1   Home Layout: One level     Bathroom Shower/Tub: Tub/shower unit (cut out)         Home Equipment: Agricultural Consultant (2 wheels);Cane - single point          Prior  Functioning/Environment Prior Level of Function : Independent/Modified Independent                    OT Problem List: Decreased strength;Decreased range of motion;Decreased activity tolerance;Impaired balance (sitting and/or standing);Decreased safety awareness        OT Goals(Current goals can be found in the care plan section)   Acute Rehab OT Goals Patient Stated Goal: to go home OT Goal Formulation: With patient/family Time For Goal Achievement: 12/24/24 Potential to Achieve Goals: Good   OT Frequency:       Co-evaluation              AM-PAC OT 6 Clicks Daily Activity     Outcome Measure Help from another person eating meals?: None Help from another person taking care of personal grooming?: None Help from another person toileting, which includes using toliet, bedpan, or  urinal?: None Help from another person bathing (including washing, rinsing, drying)?: A Little Help from another person to put on and taking off regular upper body clothing?: None Help from another person to put on and taking off regular lower body clothing?: A Little 6 Click Score: 22   End of Session Nurse Communication: Mobility status  Activity Tolerance: Patient tolerated treatment well Patient left: in chair;with call bell/phone within reach;with nursing/sitter in room;with family/visitor present  OT Visit Diagnosis: Unsteadiness on feet (R26.81);Other abnormalities of gait and mobility (R26.89)                Time: 8876-8866 OT Time Calculation (min): 10 min Charges:  OT General Charges $OT Visit: 1 Visit OT Evaluation $OT Eval Low Complexity: 1 Low  Elston Slot, M.S. OTR/L  12/10/2024, 11:56 AM  ascom [phone]

## 2024-12-10 NOTE — Discharge Summary (Signed)
 Physician Discharge Summary  RAYMUNDO ROUT FMW:[id number] DOB: 28-Sep-1931 DOA: 12/08/2024  PCP: [name], MD  Admit date: 12/08/2024 Discharge date: 12/10/2024  Admitted From: Home Disposition: Home Recommendations for Outpatient Follow-up:  Outpatient follow-up with PCP, cardiology and general surgery has been Check blood pressure, CMP and CBC at follow-up Please follow up on the following pending results: None  Home Health: No need identified Equipment/Devices: No need identified  Discharge Condition: Stable CODE STATUS: DNR   Follow-up Information     Paraschos, Alexander, MD. Go in 2 week(s).   Specialty: Cardiology Why: Appointment scheduled for 12/31/23 at 3:45 PM Contact information: [address] [facility] [place] [address] [phone]         Terryl Arthea SAUNDERS, PA-C. Go on 12/14/2024.   Specialty: Physician Assistant Why: Go to appointment on 12/22 at 1015 AM Contact information: [address] [place] [address] [phone]         Fernande Ophelia JINNY III, MD. Schedule an appointment as soon as possible for a visit in 1 week(s).   Specialty: Internal Medicine Contact information: [address] [facility] [place] [address] [phone]                 Hospital course 89 year old M with PMH of HFmrEF, COPD, pulmonary fibrosis, CAD, pacemaker, DM-2, CKD-3B, RA, HTN, HLD and GERD presented to ED with nausea, vomiting and RUQ pain, admitted with working diagnosis of acute calculous cholecystitis.  In ED, stable vitals.  WBC 14.8.  Lactic acid 2.5.  AST 133.  ALP 100.  Total bili 1.7.  RUQ US  raise concern for acute calculous cholecystitis.  Troponin elevated to 52 and trended up to 77.  EKG nonacute.  Patient was started on IV Zosyn .  General surgery and cardiology consulted.  GI recommended MRCP but patient with pacemaker that is not compatible with MRI.  Blood cultures  NGTD.  Evaluated by cardiology and cleared for surgical intervention patient underwent laparoscopic cholecystectomy with JP drain placement on 12/09/2024.   On the day of discharge, he felt better.  Cleared for discharge by cardiology and general surgery.  Surgery recommended discharge on p.o. Augmentin  for a total of 7 days.  He is discharged with JP drain.  Outpatient follow-up with PCP, cardiology and general surgery as above.  Advised to hold his Jardiance  and methotrexate  until he finished antibiotic course.    See individual problem list below for more.   Problems addressed during this hospitalization Gallstone and cholecystitis, acute:  Elevated liver enzymes: Improved -S/p laparoscopic cholecystectomy and drain placement -Received IV Zosyn  in house.  Discharge on p.o. Augmentin  for 6 more days. -Advised to hold his Jardiance  and methotrexate  until he finished antibiotic course.  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  Mild elevated proBNP to 2000.  Patient without cardiopulmonary symptoms.  Appears euvolemic on exam. -Advised to hold his Jardiance  until he finished antibiotic course -Outpatient follow-up with cardiology  Elevated troponin: Likely demand ischemia in the setting of the above History of CAD: No cardiopulmonary symptoms. -Outpatient follow-up with cardiology -Continue low-dose aspirin   Chronic COPD/pulmonary fibrosis: Stable -Continue home meds   HTN: Normotensive for most appropriate   CKD-3B: Stable   Rheumatoid arthritis -Methotrexate  weekly-to be resumed after antibiotic course  NIDDM-2 with hyperglycemia: On Jardiance  and Actos  at home - Holding Jardiance  in setting of active infection -  Continue Actos    Paroxysmal A-fib: Rate controlled not on medication  Constipation - Bowel regimen   Pacemaker in place:  Body mass index is 30.26 kg/m.            Consultations: Cardiology General Surgery  Time spent 35  minutes  Vital signs Vitals:   12/09/24 1651 12/09/24 2044 12/10/24 0439 12/10/24 0444  BP: (!) 157/72 (!) 128/59 119/69   Pulse: 83 78 80   Temp: 97.8 F (36.6 C) 97.7 F (36.5 C) 98 F (36.7 C)   Resp: 18 20 20    Height:      Weight:    101.2 kg  SpO2: 96% 98% 95%   TempSrc:  Oral    BMI (Calculated):    30.25     Discharge exam  GENERAL: No apparent distress.  Nontoxic. HEENT: MMM.  Vision and hearing grossly intact.  NECK: Supple.  No apparent JVD.  RESP:  No IWOB.  Fair aeration bilaterally. CVS:  RRR. Heart sounds normal.  ABD/GI/GU: BS+.  Appropriately tender.  JP drain with serosanguineous fluid MSK/EXT:  Moves extremities. No apparent deformity. No edema.  SKIN: no apparent skin lesion or wound NEURO: Awake and alert. Oriented appropriately.  No apparent focal neuro deficit. PSYCH: Calm. Normal affect.   Discharge Instructions Discharge Instructions     Discharge instructions   Complete by: As directed    It has been a pleasure taking care of you!  You were hospitalized with gallbladder infection for which you have been treated surgically and medically.  We are discharging you on oral antibiotics to complete treatment course.  There is very important that you complete the whole course of antibiotics regardless of improvement.  Follow-up with your primary care doctor in 1 to 2 weeks or sooner if needed.  Follow-up with your surgeon and cardiologist per their recommendation   Take care,   Increase activity slowly   Complete by: As directed    No wound care   Complete by: As directed       Allergies as of 12/10/2024       Reactions   Atorvastatin    Other reaction(s): Muscle Pain   Lipitor [atorvastatin Calcium]    Tamsulosin    Other reaction(s): Other (See Comments) Nasal congestion        Medication List     PAUSE taking these medications    Jardiance  10 MG Tabs tablet Wait  to take this until: December 17, 2024 Generic drug: empagliflozin  Take 1 tablet (10 mg total) by mouth daily.       TAKE these medications    amoxicillin -clavulanate 875-125 MG tablet Commonly known as: AUGMENTIN  Take 1 tablet by mouth 2 (two) times daily for 6 days.   aspirin 81 MG tablet Take 81 mg by mouth daily.   betamethasone  dipropionate 0.05 % cream Apply topically 2 (two) times daily. To the foreskin   carboxymethylcellulose 0.5 % Soln Commonly known as: REFRESH PLUS Place 1 drop into both eyes daily as needed (dry eyes).   diclofenac  Sodium 1 % Gel Commonly known as: VOLTAREN  Apply 2 grams topically to the affected areas 3 (three) times daily.   folic acid  1 MG tablet Commonly known as: FOLVITE  Take 1 tablet (1 mg total) by mouth daily.   methotrexate  2.5 MG tablet Commonly known as: RHEUMATREX Take 6 tablets (15 mg total) by mouth every 7 days.   nystatin  cream Commonly known as: MYCOSTATIN  Apply 1 Application topically 2 (two) times daily. Inside  the foreskin with a Q-tip   pantoprazole  40 MG tablet Commonly known as: PROTONIX  Take 1 tablet (40 mg total) by mouth daily.   pioglitazone  15 MG tablet Commonly known as: ACTOS  Take 1 tablet (15 mg total) by mouth daily.   polyethylene glycol powder 17 GM/SCOOP powder Commonly known as: MiraLax  Take 17 g by mouth 2 (two) times daily as needed for moderate constipation or mild constipation.   senna-docusate 8.6-50 MG tablet Commonly known as: Senokot-S Take 1 tablet by mouth 2 (two) times daily between meals as needed for mild constipation.   triamcinolone ointment 0.1 % Commonly known as: KENALOG Apply 1 Application topically 2 (two) times daily.   True Metrix Blood Glucose Test test strip Generic drug: glucose blood SMARTSIG:Via Meter   True Metrix Blood Glucose Test test strip Generic drug: glucose blood use as directed to test daily   TRUEplus Lancets 33G Misc   TRUEplus Lancets 33G  Misc use as directed to test blood sugar daily   Vitamin D3 25 MCG (1000 UT) Caps Take 1,000 Units by mouth daily.         Procedures/Studies: 12/17-laparoscopic cholecystectomy with JP drain placement   DG Cholangiogram Operative Result Date: 12/09/2024 EXAM: INTRAOPERATIVE CHOLANGIOGRAM TECHNIQUE: Fluoroscopy provided by the radiology department. Radiologist was not present during procedure. Image(s) submitted for review. FLUOROSCOPY DOSE AND TYPE: Radiation Dose Index: Reference Air Kerma (in mGy) = 8.08 Fluoroscopy time is 22 seconds. COMPARISON: Ultrasound abdomen 12/08/2024. CLINICAL HISTORY: [id number] Surgery, elective [id number] Surgery, elective [phone]. FINDINGS: Contrast injection of the cystic duct demonstrates no significant intra- or extrahepatic bile duct dilatation. Contrast material flows freely to the duodenum without focal filling defect. No stones or obstructing lesions identified. IMPRESSION: 1. No evidence of biliary obstruction. Electronically signed by: Elsie Gravely MD 12/09/2024 07:16 PM EST RP Workstation: [id number]   US  ABDOMEN LIMITED RUQ (LIVER/GB) Result Date: 12/08/2024 EXAM: Right Upper Quadrant Abdominal Ultrasound 12/08/2024 05:56:34 PM TECHNIQUE: Real-time ultrasonography of the right upper quadrant of the abdomen was performed. COMPARISON: Ultrasound abdomen 08/02/2016 and CT chest abdomen and pelvis 06/21/2014. CLINICAL HISTORY: RUQ pain. FINDINGS: LIVER: The liver demonstrates normal echogenicity. No intrahepatic biliary ductal dilatation. No evidence of mass. BILIARY SYSTEM: Cholelithiasis with multiple stones and sludge in the gallbladder. Stone seen in the gallbladder neck. Gallbladder wall thickening measuring up to 9 mm. No pericholecystic fluid. Changes may represent acute cholecystitis in the appropriate clinical setting. Common bile duct measures 4 mm in diameter. RIGHT KIDNEY: The right kidney is grossly unremarkable in appearances without evidence of  hydronephrosis, echogenic calculi or worrisome mass lesions. PANCREAS: Visualized portions of the pancreas are unremarkable. OTHER: No right upper quadrant ascites. IMPRESSION: 1. Cholelithiasis with multiple stones and sludge in the gallbladder, including a stone in the gallbladder neck. 2. Gallbladder wall thickening measuring up to 9 mm. 3. Findings may represent acute cholecystitis in the appropriate clinical setting. Electronically signed by: Elsie Gravely MD 12/08/2024 06:03 PM EST RP Workstation: [id number]       The results of significant diagnostics from this hospitalization (including imaging, microbiology, ancillary and laboratory) are listed below for reference.     Microbiology: Recent Results (from the past 240 hours)  Resp panel by RT-PCR (RSV, Flu A&B, Covid) Anterior Nasal Swab     Status: None   Collection Time: 12/08/24  5:07 PM   Specimen: Anterior Nasal Swab  Result Value Ref Range Status   SARS Coronavirus 2 by RT PCR NEGATIVE NEGATIVE Final    Comment: (NOTE)  SARS-CoV-2 target nucleic acids are NOT DETECTED.  The SARS-CoV-2 RNA is generally detectable in upper respiratory specimens during the acute phase of infection. The lowest concentration of SARS-CoV-2 viral copies this assay can detect is 138 copies/mL. A negative result does not preclude SARS-Cov-2 infection and should not be used as the sole basis for treatment or other patient management decisions. A negative result may occur with  improper specimen collection/handling, submission of specimen other than nasopharyngeal swab, presence of viral mutation(s) within the areas targeted by this assay, and inadequate number of viral copies(<138 copies/mL). A negative result must be combined with clinical observations, patient history, and epidemiological information. The expected result is Negative.  Fact Sheet for Patients:  bloggercourse.com  Fact Sheet for Healthcare Providers:   seriousbroker.it  This test is no t yet approved or cleared by the [place]  FDA and  has been authorized for detection and/or diagnosis of SARS-CoV-2 by FDA under an Emergency Use Authorization (EUA). This EUA will remain  in effect (meaning this test can be used) for the duration of the COVID-19 declaration under Section 564(b)(1) of the Act, 21 U.S.C.section 360bbb-3(b)(1), unless the authorization is terminated  or revoked sooner.       Influenza A by PCR NEGATIVE NEGATIVE Final   Influenza B by PCR NEGATIVE NEGATIVE Final    Comment: (NOTE) The Xpert Xpress SARS-CoV-2/FLU/RSV plus assay is intended as an aid in the diagnosis of influenza from Nasopharyngeal swab specimens and should not be used as a sole basis for treatment. Nasal washings and aspirates are unacceptable for Xpert Xpress SARS-CoV-2/FLU/RSV testing.  Fact Sheet for Patients: bloggercourse.com  Fact Sheet for Healthcare Providers: seriousbroker.it  This test is not yet approved or cleared by the [place]  FDA and has been authorized for detection and/or diagnosis of SARS-CoV-2 by FDA under an Emergency Use Authorization (EUA). This EUA will remain in effect (meaning this test can be used) for the duration of the COVID-19 declaration under Section 564(b)(1) of the Act, 21 U.S.C. section 360bbb-3(b)(1), unless the authorization is terminated or revoked.     Resp Syncytial Virus by PCR NEGATIVE NEGATIVE Final    Comment: (NOTE) Fact Sheet for Patients: bloggercourse.com  Fact Sheet for Healthcare Providers: seriousbroker.it  This test is not yet approved or cleared by the [place]  FDA and has been authorized for detection and/or diagnosis of SARS-CoV-2 by FDA under an Emergency Use Authorization (EUA). This EUA will remain in effect (meaning this test can be used) for  the duration of the COVID-19 declaration under Section 564(b)(1) of the Act, 21 U.S.C. section 360bbb-3(b)(1), unless the authorization is terminated or revoked.  Performed at [facility], [address] [place], [place] [address]   Blood culture (routine x 2)     Status: None (Preliminary result)   Collection Time: 12/08/24  7:08 PM   Specimen: BLOOD  Result Value Ref Range Status   Specimen Description BLOOD BLOOD RIGHT FOREARM  Final   Special Requests   Final    BOTTLES DRAWN AEROBIC AND ANAEROBIC Blood Culture results may not be optimal due to an inadequate volume of blood received in culture bottles   Culture   Final    NO GROWTH 2 DAYS Performed at [facility], [address], [place], [place] [address]    Report Status PENDING  Incomplete  Blood culture (routine x 2)     Status: None (Preliminary result)   Collection Time: 12/08/24  7:08 PM   Specimen: BLOOD  Result Value Ref Range Status   Specimen Description BLOOD LEFT ANTECUBITAL  Final   Special Requests   Final    BOTTLES DRAWN AEROBIC AND ANAEROBIC Blood Culture results may not be optimal due to an inadequate volume of blood received in culture bottles   Culture   Final    NO GROWTH 2 DAYS Performed at [facility], [address] [place], [place] [address]    Report Status PENDING  Incomplete     Labs:  CBC: Recent Labs  Lab 12/08/24 1645 12/09/24 0207 12/10/24 0435  WBC 14.8* 18.2* 13.7*  NEUTROABS 13.3*  --  11.7*  HGB 12.6* 12.4* 11.7*  HCT 38.2* 36.2* 34.5*  MCV 91.6 89.8 91.0  PLT 180 172 125*   BMP &GFR Recent Labs  Lab 12/08/24 1645 12/09/24 0207 12/10/24 0435  NA 139 138 137  K 3.9 3.9 3.6  CL 101 101 101  CO2 25 25 23   GLUCOSE 210* 151* 138*  BUN 28* 27* 34*  CREATININE 1.43* 1.42* 1.35*  CALCIUM 9.6 9.2 8.6*  MG 2.0  --   --    Estimated Creatinine Clearance: 43.9 mL/min (A) (by C-G formula based on SCr of 1.35 mg/dL (H)). Liver &  Pancreas: Recent Labs  Lab 12/08/24 1645 12/09/24 0207 12/10/24 0435  AST 133* 114* 91*  ALT 79* 99* 73*  ALKPHOS 100 101 101  BILITOT 1.7* 1.5* 1.1  PROT 7.5 6.9 6.4*  ALBUMIN 4.0 3.7 3.2*   Recent Labs  Lab 12/08/24 1645  LIPASE 36   No results for input(s): AMMONIA in the last 168 hours. Diabetic: No results for input(s): HGBA1C in the last 72 hours. Recent Labs  Lab 12/09/24 1637 12/09/24 1658 12/09/24 2127 12/10/24 0845 12/10/24 1134  GLUCAP 188* 159* 171* 144* 173*   Cardiac Enzymes: No results for input(s): CKTOTAL, CKMB, CKMBINDEX, TROPONINI in the last 168 hours. Recent Labs    12/08/24 1645  PROBNP 2,093.0*   Coagulation Profile: Recent Labs  Lab 12/08/24 1645  INR 1.2   Thyroid  Function Tests: No results for input(s): TSH, T4TOTAL, FREET4, T3FREE, THYROIDAB in the last 72 hours. Lipid Profile: No results for input(s): CHOL, HDL, LDLCALC, TRIG, CHOLHDL, LDLDIRECT in the last 72 hours. Anemia Panel: No results for input(s): VITAMINB12, FOLATE, FERRITIN, TIBC, IRON, RETICCTPCT in the last 72 hours. Urine analysis:    Component Value Date/Time   COLORURINE YELLOW (A) 12/08/2024 1720   APPEARANCEUR CLEAR (A) 12/08/2024 1720   APPEARANCEUR Clear 10/08/2024 0933   LABSPEC 1.024 12/08/2024 1720   LABSPEC 1.016 06/06/2014 0122   PHURINE 6.0 12/08/2024 1720   GLUCOSEU >=500 (A) 12/08/2024 1720   GLUCOSEU Negative 06/06/2014 0122   HGBUR SMALL (A) 12/08/2024 1720   BILIRUBINUR NEGATIVE 12/08/2024 1720   BILIRUBINUR Negative 10/08/2024 0933   BILIRUBINUR Negative 06/06/2014 0122   KETONESUR NEGATIVE 12/08/2024 1720   PROTEINUR NEGATIVE 12/08/2024 1720   NITRITE NEGATIVE 12/08/2024 1720   LEUKOCYTESUR NEGATIVE 12/08/2024 1720   LEUKOCYTESUR Negative 06/06/2014 0122   Sepsis Labs: Invalid input(s): PROCALCITONIN, LACTICIDVEN   SIGNED:  Shajuan Musso T Catina Nuss, MD  [facility] 12/10/2024, 5:41  PM

## 2024-12-10 NOTE — Discharge Instructions (Signed)
 In addition to included general post-operative instructions,  Diet: Resume home diet. Recommend avoiding or limiting fatty/greasy foods over the next few days/week. If you do eat these, you may (or may not) notice diarrhea. This is expected while your body adjusts to not having a gallbladder, and it typically resolves with time.    Activity: No heavy lifting >20 pounds (children, pets, laundry, garbage) or strenuous activity for 4 weeks, but light activity and walking are encouraged. Do not drive or drink alcohol  if taking narcotic pain medications or having pain that might distract from driving.  Drain: Monitor and record drain output each day. Hand out given for this. Please this with you to follow up on 12/22 at 1015 AM  Wound care: If you can keep drain site covered, you may shower/get incision wet with soapy water and pat dry (do not rub incisions), but no baths or submerging incision underwater until follow-up.   Medications: Resume all home medications. For mild to moderate pain: acetaminophen  (Tylenol ) or ibuprofen/naproxen (if no kidney disease). Combining Tylenol  with alcohol  can substantially increase your risk of causing liver disease. Narcotic pain medications, if prescribed, can be used for severe pain, though may cause nausea, constipation, and drowsiness. Do not combine Tylenol  and Percocet (or similar) within a 6 hour period as Percocet (and similar) contain(s) Tylenol . If you do not need the narcotic pain medication, you do not need to fill the prescription.  Call office [phone] / [phone]) at any time if any questions, worsening pain, fevers/chills, bleeding, drainage from incision site, or other concerns.

## 2024-12-12 LAB — SURGICAL PATHOLOGY

## 2024-12-13 LAB — CULTURE, BLOOD (ROUTINE X 2)
Culture: NO GROWTH
Culture: NO GROWTH

## 2024-12-14 ENCOUNTER — Encounter: Admitting: Physician Assistant

## 2024-12-14 ENCOUNTER — Encounter: Payer: Self-pay | Admitting: Physician Assistant

## 2024-12-14 VITALS — BP 128/68 | HR 85 | Temp 97.9°F | Ht 72.0 in | Wt 215.6 lb

## 2024-12-14 DIAGNOSIS — K819 Cholecystitis, unspecified: Secondary | ICD-10-CM

## 2024-12-14 DIAGNOSIS — Z09 Encounter for follow-up examination after completed treatment for conditions other than malignant neoplasm: Secondary | ICD-10-CM

## 2024-12-14 DIAGNOSIS — K810 Acute cholecystitis: Secondary | ICD-10-CM

## 2024-12-14 NOTE — Progress Notes (Signed)
 [facility] POST-OP OFFICE VISIT  12/14/2024  HPI: Randy Lopez is a 89 y.o. male 5 days s/p laparoscopic cholecystectomy and umbilical hernia repair with Dr Jordis  He has done well given the circumstances Sore; worse at umbilicus No fever, chills, nausea, emesis He is having diarrhea but reports he was taking Miralax  twice daily; stopped and this is improving Drain serous Incisions are well healed Ambulatory   Vital signs: BP 128/68   Pulse 85   Temp 97.9 F (36.6 C) (Oral)   Ht 6' (1.829 m)   Wt 215 lb 9.6 oz (97.8 kg)   SpO2 97%   BMI 29.24 kg/m    Physical Exam: Constitutional: Well appearing male, NAD Abdomen: Soft, non-tender, non-distended, no rebound/guarding. Drain in RUQ; output serous  Skin: Laparoscopic incisions are healing well, no erythema or drainage   Assessment/Plan: This is a 89 y.o. male 5 days s/p laparoscopic cholecystectomy and umbilical hernia repair with Dr Jordis   - Drain removed; No issues.   - Pain control prn  - Reviewed wound care recommendation  - Reviewed lifting restrictions; 6 weeks total  - Reviewed surgical pathology; CCC  - I will follow up in a few weeks; He understands to call with questions/concerns in the interim  -- Arthea Platt, PA-C [facility] 12/14/2024, 10:26 AM M-F: 7am - 4pm

## 2024-12-14 NOTE — Patient Instructions (Signed)

## 2024-12-15 ENCOUNTER — Other Ambulatory Visit (HOSPITAL_COMMUNITY): Payer: Self-pay

## 2024-12-16 ENCOUNTER — Other Ambulatory Visit (HOSPITAL_COMMUNITY): Payer: Self-pay

## 2024-12-16 MED ORDER — PANTOPRAZOLE SODIUM 40 MG PO TBEC
40.0000 mg | DELAYED_RELEASE_TABLET | Freq: Every day | ORAL | 1 refills | Status: AC
  Filled 2024-12-16: qty 90, 90d supply, fill #0

## 2024-12-30 ENCOUNTER — Ambulatory Visit: Admitting: Urology

## 2024-12-30 NOTE — Progress Notes (Signed)
 Established Patient Visit   Chief Complaint: Chief Complaint  Patient presents with   Follow-up    Hospital follow up   Date of Service: 12/30/2024 Date of Birth: 1931/07/05 PCP: Fernande Ophelia Marinell DOUGLAS, MD  History of Present Illness: Randy Lopez is a 89 y.o.male patient who returns for    1.  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  Essential hypertension  3.  Hyperlipidemia  4.  Type 2 diabetes  5.  Rheumatoid arthritis  6.  Chronic HFmrEF   NYHA class II-III   Etiology: Nonischemic   EF 45% 10/10/2016  7.  Subclinical AF noted on pacemaker interrogation  8.  COPD / pulmonary fibrosis / bronchiectasis  9.  Pacemaker changed out ([employer] Azure XT DR MRI) at [facility]/09/2024 (AP)  Patient was seen for an acute visit on 10/21/2023.  He reported increased shortness of breath, chronic peripheral edema, weight gain, generalized weakness, difficulty getting up. ECG revealed atrial sensing with ventricular pacing 64 bpm.  Oxygen saturation was 97% on room air.  Patient had gained 6 pounds since his previous office visit.  Most recent BUN and creatinine were 20 and 1.3 on 07/24/2023.  The patient was taking torsemide  20 mg every morning 4-5 times a week.  The patient was instructed to increase torsemide  to 40 mg daily.  The patient was seen by Dr. Merline 11/05/2023, at which time the patient was doing better, with less shortness of breath and peripheral edema.  Torsemide  was decreased to 20 mg daily.   Patient admitted 12/08/2024 with acute cholecystitis and underwent successful laparoscopic cholecystectomy.  The patient returns for follow-up, reports doing well.  He denies chest pain.  He has chronic exertional dyspnea which overall has improved.  He denies palpitations or heart racing.  He has chronic peripheral edema which is improved.  The patient is active but does not exercise regularly.  2D echocardiogram 11/14/2023 revealed LVEF 45% with mild mitral and tricuspid  regurgitation.  The patient underwent successful pacemaker generator change 04/02/2024.  ECG/20 01/2024 revealed atrial sensing with ventricular pacing at 78 bpm.  d.  7-day Holter monitor 10/03/2021-10/10/2021 revealed predominant atrial sensing ventricular pacing and atrial pacing ventricular pacing with mean heart rate of 70 bpm, heart rate range 60 to 128 bpm, and occasional premature atrial contractions.  2D echocardiogram 10/10/2016 revealed normal left ventricular function, with LVEF 45%, with mild to moderate mitral regurgitation.  The patient has essential hypertension, blood pressure well-controlled, currently on amlodipine, losartan and hydrochlorothiazide, which are well tolerated without apparent side effects.  The patient follows a low-sodium, no added salt diet.  The patient has hyperlipidemia, LDL cholesterol was 130 on 06/07/2021, currently on diet therapy.  The patient follows a low-cholesterol low-fat diet.  The patient has type 2 diabetes, hemoglobin A1c was 8.2 on 02/24/2019, currently on glimepiride  which is tolerated well without apparent side effects, followed by his primary care provider.  The patient follows a low carbohydrate, ADA diet.  Past Medical and Surgical History  Past Medical History Past Medical History:  Diagnosis Date   Bilateral renal cysts 08/06/2016   Small, on U/S 8/17   Bradycardia    significant bradycardia,asymptomatic;Holter revealing Mobitz type 2 block while sleeping.Evaluated by cardiology,Dr.Fath.Recommended to go on CPAP.Progressive bradycardia noted 2014.s/p PPM.   Calculus of gallbladder without cholecystitis 08/06/2016   By U/S 8/17   CKD (chronic kidney disease) stage 3, GFR 30-59 ml/min (CMS-HCC) 11/04/2014   Diabetes mellitus type 2, uncomplicated (CMS/HHS-HCC)  Dupuytren's contracture    ED (erectile dysfunction)    History of bone density study 04/10/11   Hyperlipidemia    Hypertension    Pneumonitis 6/11   Hosptitalized  with bilateral interstitial pneumonitis;s/p bronchoscopy with etiology unclear.Responded rapidly to steroids,levaquin   Prostatic hypertrophy    with transurethral procedure 2005,2009.Followed by Urology   Rheumatoid arthritis (CMS/HHS-HCC)    positive rheumatoid factor,positive anti-CCP antibodies;methotrexate ;remicade   Sleep apnea 2012   by sleep study;declined CPAP titration   Type II diabetes mellitus with renal manifestations (CMS/HHS-HCC) 11/04/2014    Past Surgical History He has a past surgical history that includes status post pacemaker and egd (05/30/2016).   Medications and Allergies  Current Medications  Current Outpatient Medications  Medication Sig Dispense Refill   aspirin 81 MG EC tablet Take 81 mg by mouth once daily.     blood glucose control, normal Soln Use as directed dx E11.29 True Metrix Level 1 1 each 0   blood glucose diagnostic test strip 1 each (1 strip total) once daily Use as instructed. 100 each 2   blood glucose meter kit as directed 1 each 0   carboxymethylcellulose (REFRESH TEARS) 0.5 % ophthalmic solution Place 1-2 drops into both eyes as needed for Dry Eyes     cholecalciferol (VITAMIN D3) 1,000 unit capsule Take 1,000 Units by mouth once daily.     diclofenac  (VOLTAREN ) 1 % topical gel Apply 2 g topically 3 (three) times daily 100 g 11   empagliflozin  (JARDIANCE ) 10 mg tablet Take 1 tablet (10 mg total) by mouth once daily 90 tablet 3   finasteride  (PROSCAR ) 5 mg tablet TAKE 1 TABLET EVERY DAY 90 tablet 1   folic acid  (FOLVITE ) 1 MG tablet Take 1 tablet (1 mg total) by mouth once daily for 360 days 90 tablet 3   folic acid  (FOLVITE ) 400 MCG tablet Take 1 tablet (400 mcg total) by mouth once daily 90 tablet 3   glimepiride  (AMARYL ) 4 MG tablet TAKE 1 TABLET EVERY DAY WITH BREAKFAST 90 tablet 3   lancets 33 gauge Misc Use 1 each once daily TruePlus dx E11.29 100 each 1   lancets Use 1 each once daily Use as instructed. 100 each 2    methotrexate  (RHEUMATREX) 2.5 MG tablet Take 6 tablets (15 mg total) by mouth every 7 (seven) days 72 tablet 1   nystatin  (MYCOSTATIN ) 100,000 unit/gram cream Apply 1 Application topically     ONETOUCH ULTRA2 METER kit Use as instructed. 1 each 0   pantoprazole  (PROTONIX ) 40 MG DR tablet Take 1 tablet (40 mg total) by mouth daily. 90 tablet 1   pioglitazone  (ACTOS ) 15 MG tablet Take 1 tablet (15 mg total) by mouth once daily 90 tablet 3   polyethylene glycol (MIRALAX ) powder Take 17 g by mouth     sennosides-docusate (SENOKOT-S) 8.6-50 mg tablet Take 1 tablet by mouth     TORsemide  (DEMADEX ) 20 MG tablet TAKE 1 TABLET (20 MG TOTAL) BY MOUTH ONCE DAILY AS NEEDED FOR UP TO 30 DAYS 60 tablet 5   triamcinolone 0.1 % ointment Apply 1 Application topically 2 (two) times daily     blood glucose meter kit as directed Dx E11.29 [employer] Metrix 1 each 0   No current facility-administered medications for this visit.    Allergies: Flomax [tamsulosin] and Lipitor [atorvastatin]  Social and Family History  Social History  reports that he quit smoking about 38 years ago. His smoking use included cigarettes. He started smoking about  73 years ago. He has a 35 pack-year smoking history. He has been exposed to tobacco smoke. He has never used smokeless tobacco. He reports that he does not drink alcohol .  Family History Family History  Problem Relation Name Age of Onset   Diabetes Mother     Stroke Father     Leukemia Brother     Other Brother         MVA    Review of Systems   Review of Systems: The patient denies chest pain, shortness of breath, orthopnea, paroxysmal nocturnal dyspnea, pedal edema, palpitations, prolonged heart racing, presyncope, syncope. Review of 10 Systems is negative except as described above.  Physical Examination   Vitals: BP 120/78   Pulse 83   Ht 182.9 cm (6')   Wt 96.2 kg (212 lb)   SpO2 98%   BMI 28.75 kg/m  Ht:182.9 cm (6') Wt:96.2 kg (212 lb)  ADJ:Anib surface area is 2.21 meters squared. Body mass index is 28.75 kg/m.  General: Alert and oriented. Well-appearing. No acute distress. HEENT: Pupils equally reactive to light and accomodation    Neck: Supple, no JVD Lungs: Normal effort of breathing; clear to auscultation bilaterally; no wheezes, rales, rhonchi Heart: Regular rate and rhythm. No murmur, rub, or gallop.  Pacemaker site well-healed. Abdomen: nondistended, bowel sounds present  Extremities: no cyanosis, clubbing, or edema Peripheral Pulses: 2+ radial  Skin: Warm, dry, no diaphoresis Psych: Good affect, responds appropriately.   Assessment   89 y.o. male with  1. Dilated cardiomyopathy (CMS/HHS-HCC)   2. Essential (primary) hypertension   3. Moderate mitral regurgitation   4. Pacemaker   5. S/P cardiac pacemaker procedure   6. Bronchiectasis without complication (CMS/HHS-HCC)   7. Pulmonary fibrosis (CMS/HHS-HCC)   8. Sleep apnea, unspecified type   9. Hyperlipidemia, unspecified hyperlipidemia type   10. Stage 3 chronic kidney disease, unspecified whether stage 3a or 3b CKD (CMS-HCC)   11. Statin myopathy    89 year old gentleman with essential hypertension, blood pressure well controlled on current BP medications. 2D echocardiogram 10/10/2016 revealed mildly reduced left ventricular function. Pacemaker interrogation 02/14/2022 revealed normal dual-chamber pacemaker function with 2 days of greater than 4 hours of atrial fibrillation which were asymptomatic.  7-day Holter monitor (10/03/2021- 10/10/2021) revealed predominant atrial sensing ventricular pacing and atrial pacing with ventricular pacing with mean heart rate of 70 bpm with occasional PACs.  We discussed the risks, benefits alternatives of chronic anticoagulation in the setting of subclinical atrial fibrillation noted only on pacemaker interrogation, and decided against chronic anticoagulation at this time.  The patient has hyperlipidemia, LDL cholesterol  was 130, on diet therapy.  The patient was seen for acute visit with increased shortness of breath, chronic peripheral edema, weight gain, and elevated blood pressure.  Patient instructed to take torsemide  40 mg daily, with resultant 9 pound weight loss, improved peripheral edema, less shortness of breath and improved energy level.  The patient underwent successful pacemaker generator change on 04/02/2024.   Plan   1.  Continue current medications 2.  Counseled patient about low-sodium diet 3.  DASH diet printed instructions given to the patient 4.  Counseled patient about low-cholesterol diet 5.  Low-fat and cholesterol diet printed instructions given to the patient 6.  Follow-up [facility] 7.  Pacemaker interrogation remotely twice yearly, in clinic yearly 8.  Return to clinic for follow-up in 4 months  No orders of the defined types were placed in this encounter.   Return in about 4  months (around 04/29/2025).   MARSA DOOMS, MD PhD [facility]

## 2024-12-31 ENCOUNTER — Ambulatory Visit: Payer: Self-pay | Admitting: Urology

## 2025-01-06 ENCOUNTER — Ambulatory Visit (INDEPENDENT_AMBULATORY_CARE_PROVIDER_SITE_OTHER): Admitting: Physician Assistant

## 2025-01-06 ENCOUNTER — Encounter: Payer: Self-pay | Admitting: Physician Assistant

## 2025-01-06 VITALS — BP 118/58 | HR 64 | Ht 72.0 in | Wt 208.0 lb

## 2025-01-06 DIAGNOSIS — Z09 Encounter for follow-up examination after completed treatment for conditions other than malignant neoplasm: Secondary | ICD-10-CM

## 2025-01-06 DIAGNOSIS — K810 Acute cholecystitis: Secondary | ICD-10-CM

## 2025-01-06 DIAGNOSIS — K819 Cholecystitis, unspecified: Secondary | ICD-10-CM

## 2025-01-06 NOTE — Progress Notes (Signed)
 [facility] POST-OP OFFICE VISIT  01/06/2025  HPI: Randy Lopez is a 89 y.o. male s/p laparoscopic cholecystectomy and umbilical hernia repair with Dr Jordis on 12/17  He has done remarkably well given his age No abdominal pain, nausea, emesis, bowel changes Tolerating PO He is at his baseline No other complaints   Vital signs: BP (!) 118/58   Pulse 64   Ht 6' (1.829 m)   Wt 208 lb (94.3 kg)   SpO2 98%   BMI 28.21 kg/m    Physical Exam: Constitutional: Well appearing male, NAD Abdomen: Soft, non-tender, non-distended, no rebound/guarding Skin: Laparoscopic incisions are healing well, no erythema or drainage   Assessment/Plan: This is a 89 y.o. male s/p laparoscopic cholecystectomy and umbilical hernia repair with Dr Jordis on 12/17   - Pain control prn  - Reviewed wound care recommendation  - Reviewed lifting restrictions; 6 weeks total  - He can follow up on as needed basis; He understands to call with questions/concerns  -- Arthea Platt, PA-C [facility] 01/06/2025, 1:41 PM M-F: 7am - 4pm

## 2025-01-06 NOTE — Patient Instructions (Signed)

## 2025-01-19 ENCOUNTER — Ambulatory Visit: Admitting: Urology

## 2025-01-19 DIAGNOSIS — R351 Nocturia: Secondary | ICD-10-CM

## 2025-01-19 DIAGNOSIS — N401 Enlarged prostate with lower urinary tract symptoms: Secondary | ICD-10-CM

## 2025-01-20 ENCOUNTER — Other Ambulatory Visit: Payer: Self-pay

## 2025-01-27 ENCOUNTER — Other Ambulatory Visit (HOSPITAL_COMMUNITY): Payer: Self-pay

## 2025-02-03 ENCOUNTER — Ambulatory Visit: Admitting: Urology
